# Patient Record
Sex: Female | Born: 1965 | Race: Black or African American | Hispanic: No | Marital: Single | Smoking: Current every day smoker
Health system: Southern US, Community
[De-identification: ages and names within clinical notes are randomized; demographics above are authoritative.]

## PROBLEM LIST (undated history)

## (undated) DIAGNOSIS — E0590 Thyrotoxicosis, unspecified without thyrotoxic crisis or storm: Secondary | ICD-10-CM

## (undated) DIAGNOSIS — J449 Chronic obstructive pulmonary disease, unspecified: Secondary | ICD-10-CM

## (undated) DIAGNOSIS — J45909 Unspecified asthma, uncomplicated: Secondary | ICD-10-CM

## (undated) DIAGNOSIS — E119 Type 2 diabetes mellitus without complications: Secondary | ICD-10-CM

## (undated) HISTORY — PX: BELOW KNEE LEG AMPUTATION: SUR23

---

## 2008-12-05 ENCOUNTER — Other Ambulatory Visit: Admission: RE | Admit: 2008-12-05 | Discharge: 2008-12-05 | Payer: Self-pay | Admitting: Obstetrics & Gynecology

## 2008-12-05 ENCOUNTER — Encounter: Payer: Self-pay | Admitting: Obstetrics & Gynecology

## 2008-12-05 ENCOUNTER — Ambulatory Visit: Payer: Self-pay | Admitting: Obstetrics & Gynecology

## 2008-12-05 LAB — CONVERTED CEMR LAB
Trich, Wet Prep: NONE SEEN
Yeast Wet Prep HPF POC: NONE SEEN

## 2008-12-12 ENCOUNTER — Ambulatory Visit (HOSPITAL_COMMUNITY): Admission: RE | Admit: 2008-12-12 | Discharge: 2008-12-12 | Payer: Self-pay | Admitting: Obstetrics & Gynecology

## 2008-12-26 ENCOUNTER — Ambulatory Visit: Payer: Self-pay | Admitting: Obstetrics & Gynecology

## 2008-12-26 LAB — CONVERTED CEMR LAB
MCHC: 29.6 g/dL — ABNORMAL LOW (ref 30.0–36.0)
MCV: 73.4 fL — ABNORMAL LOW (ref 78.0–100.0)
Platelets: 429 10*3/uL — ABNORMAL HIGH (ref 150–400)
RBC: 5.07 M/uL (ref 3.87–5.11)
RDW: 22.4 % — ABNORMAL HIGH (ref 11.5–15.5)

## 2009-01-23 ENCOUNTER — Ambulatory Visit (HOSPITAL_COMMUNITY): Admission: RE | Admit: 2009-01-23 | Discharge: 2009-01-23 | Payer: Self-pay | Admitting: Family Medicine

## 2009-01-23 ENCOUNTER — Ambulatory Visit: Payer: Self-pay | Admitting: Obstetrics and Gynecology

## 2009-01-23 LAB — CONVERTED CEMR LAB
MCV: 73.1 fL — ABNORMAL LOW (ref 78.0–100.0)
Platelets: 340 10*3/uL (ref 150–400)
Potassium: 4.7 meq/L (ref 3.5–5.3)
RBC: 5.4 M/uL — ABNORMAL HIGH (ref 3.87–5.11)
WBC: 9.2 10*3/uL (ref 4.0–10.5)

## 2009-02-27 ENCOUNTER — Encounter: Payer: Self-pay | Admitting: Obstetrics & Gynecology

## 2009-02-27 ENCOUNTER — Ambulatory Visit (HOSPITAL_COMMUNITY): Admission: RE | Admit: 2009-02-27 | Discharge: 2009-02-27 | Payer: Self-pay | Admitting: Obstetrics & Gynecology

## 2009-02-27 ENCOUNTER — Ambulatory Visit: Payer: Self-pay | Admitting: Obstetrics & Gynecology

## 2009-03-02 ENCOUNTER — Ambulatory Visit: Payer: Self-pay | Admitting: Family Medicine

## 2009-03-02 ENCOUNTER — Inpatient Hospital Stay (HOSPITAL_COMMUNITY): Admission: AD | Admit: 2009-03-02 | Discharge: 2009-03-02 | Payer: Self-pay | Admitting: Family Medicine

## 2009-04-17 ENCOUNTER — Ambulatory Visit: Payer: Self-pay | Admitting: Obstetrics & Gynecology

## 2010-02-27 IMAGING — CT CT ABDOMEN W/ CM
3 of 6 series · 15 of 32 positions shown, 19 images · IV contrast (40ML OMNI-MIX & 150ml omni/300%)
Comparison: None.

CT ABDOMEN

CLINICAL DATA: Abdominal and pelvic pain.  Recent ablation and
fibroid resection.

CT ABDOMEN AND PELVIS WITH CONTRAST
TECHNIQUE: Multidetector CT imaging of the abdomen and pelvis was
performed using the standard protocol following bolus
administration of intravenous contrast.
Contrast: 150 ml [id number]-Y66

[Series 2: abd pelvis · axial · 0.98mm/px · z∈[-432,-192]mm · 3 of 97 slices shown, 7 images]
[im 25/97  soft-tissue]
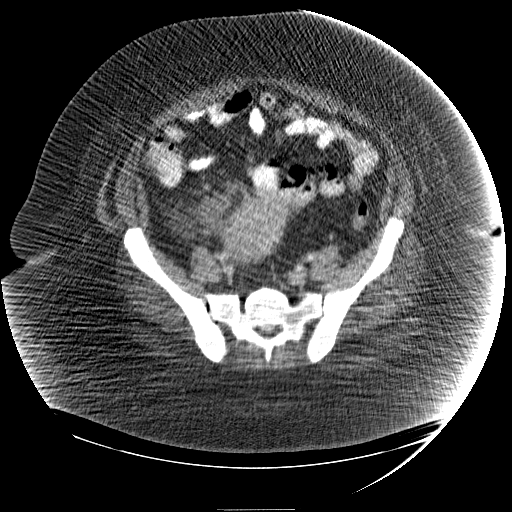
[im 25/97  lung]
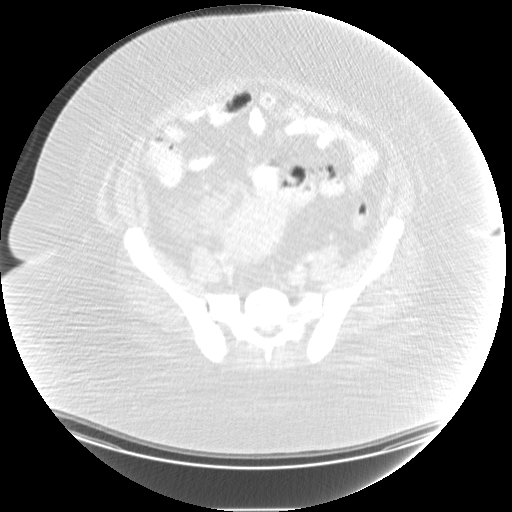
[im 25/97  bone]
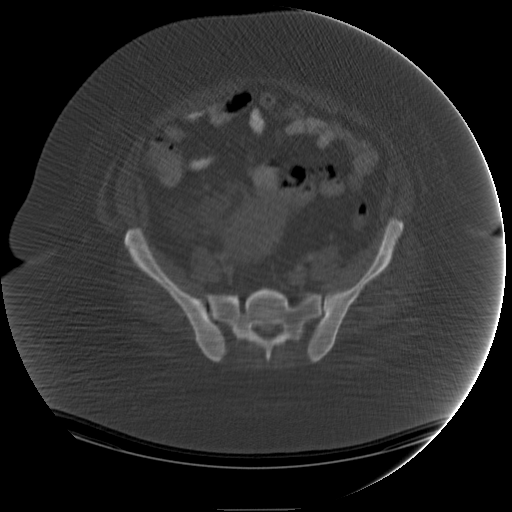
[im 49/97  soft-tissue]
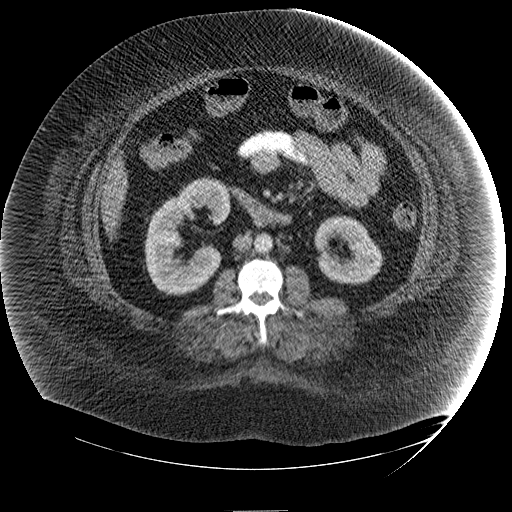
[im 49/97  lung]
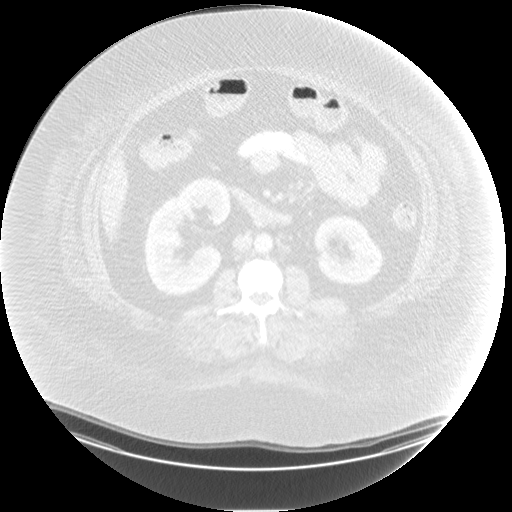
[im 73/97  soft-tissue]
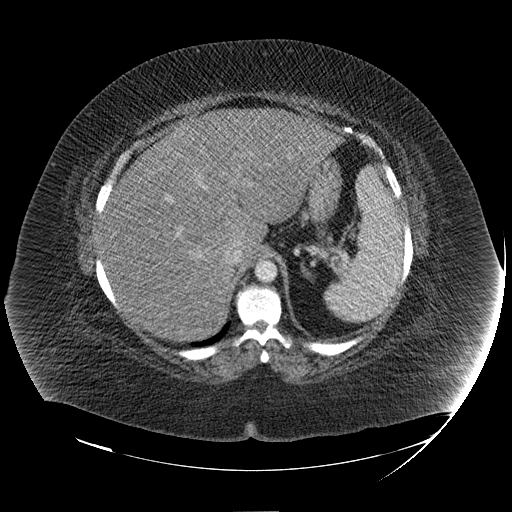
[im 73/97  lung]
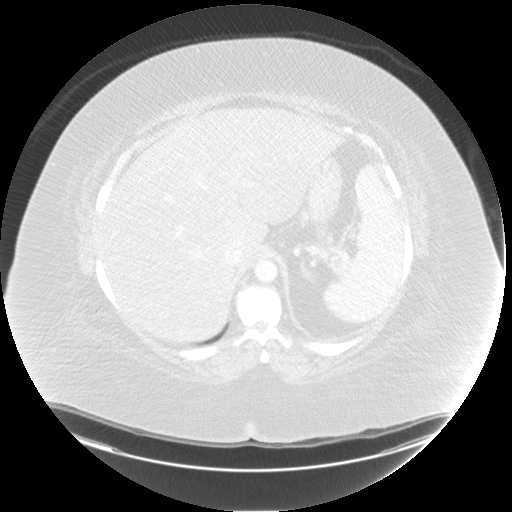

[Series 400: reformatted · coronal · 0.98mm/px · 4 of 213 slices shown (1 of 2)]
[im 27/213  soft-tissue]
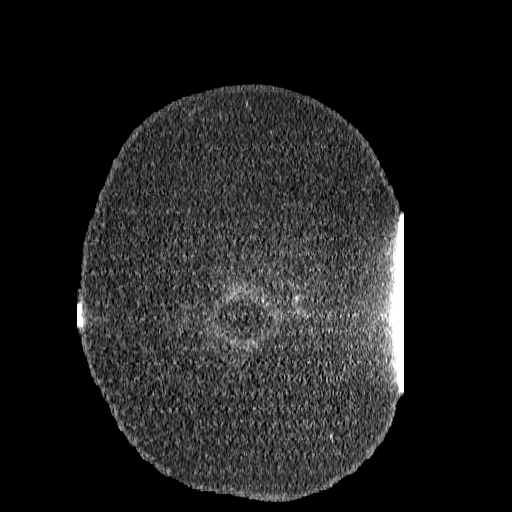
[im 54/213  soft-tissue]
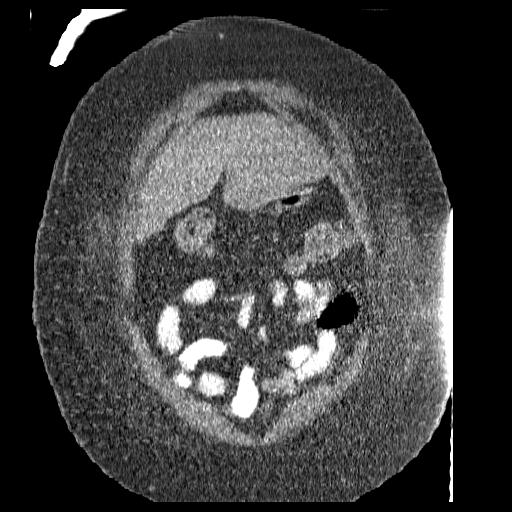
[im 80/213  soft-tissue]
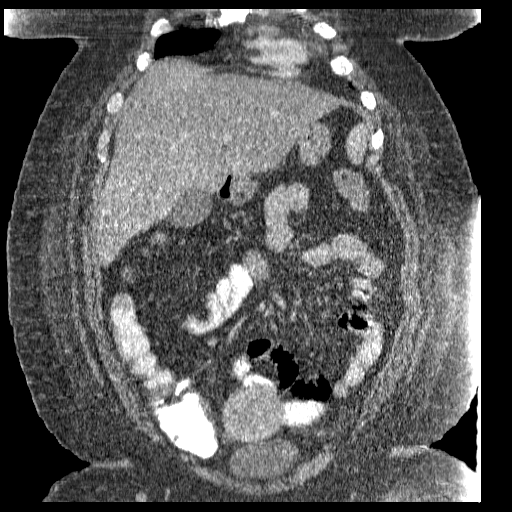
[im 107/213  soft-tissue]
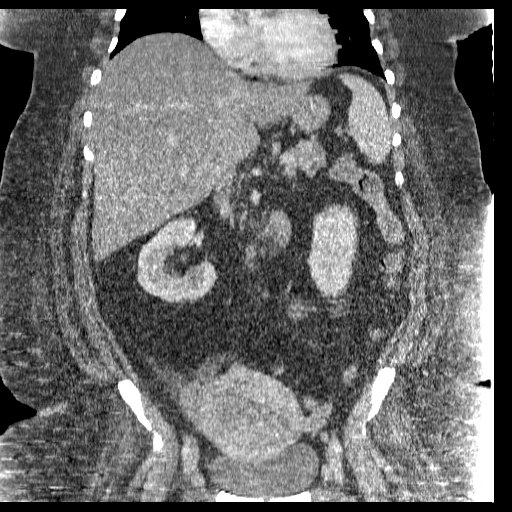

[Series 401: reformatted · sagittal · 0.98mm/px · 8 of 243 slices shown (2 of 2)]
[im 25/243  soft-tissue]
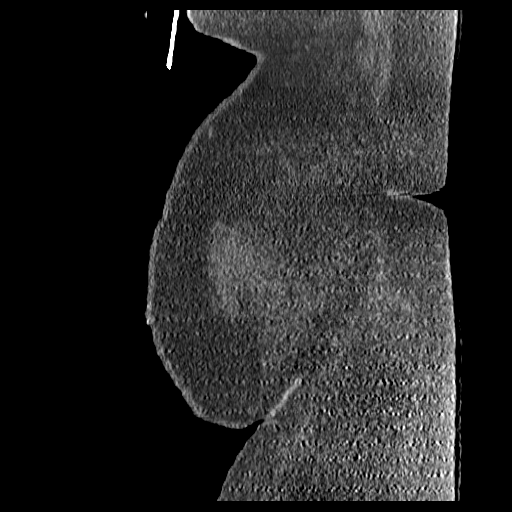
[im 49/243  soft-tissue]
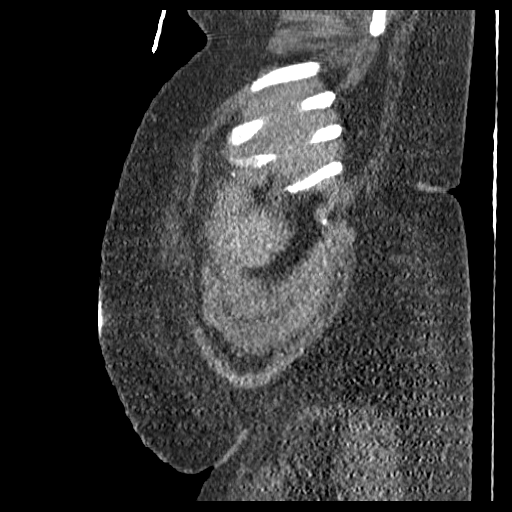
[im 73/243  soft-tissue]
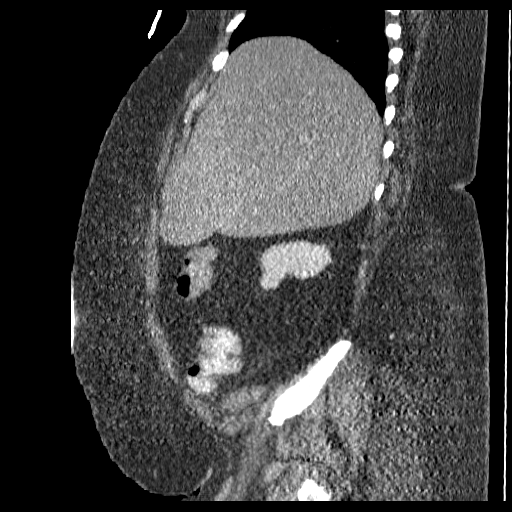
[im 97/243  soft-tissue]
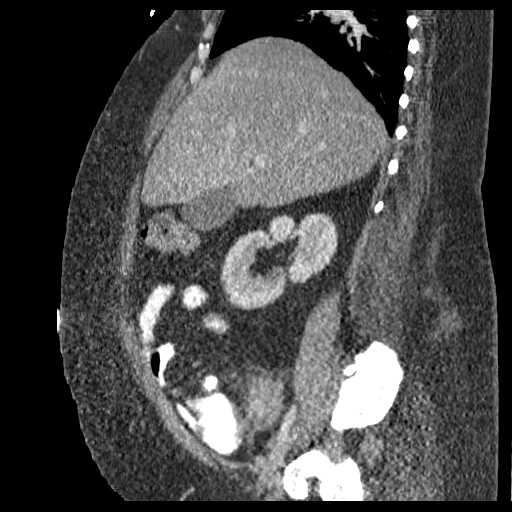
[im 146/243  soft-tissue]
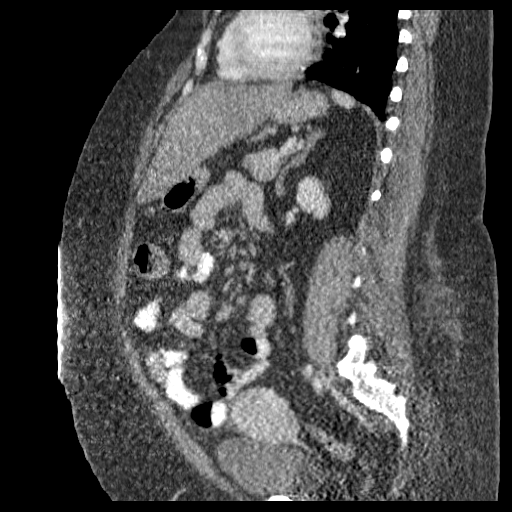
[im 170/243  soft-tissue]
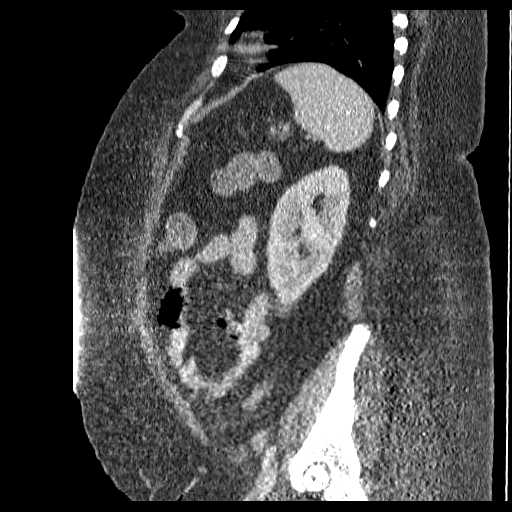
[im 194/243  soft-tissue]
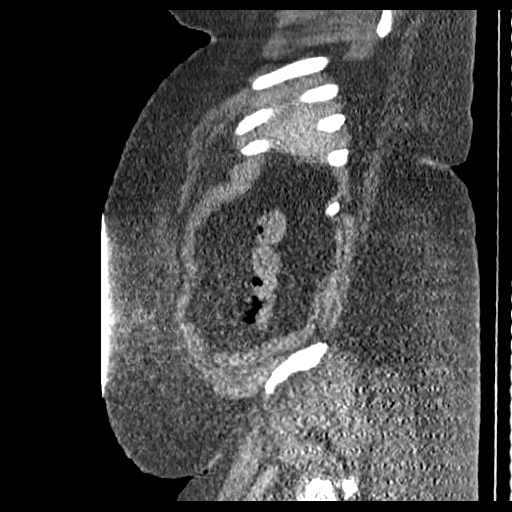
[im 218/243  soft-tissue]
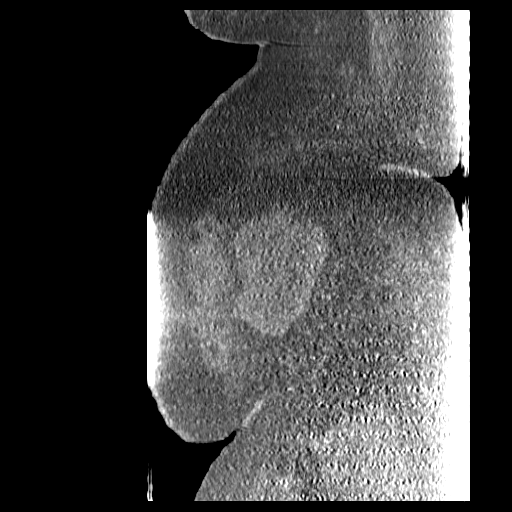

[15 of 32 positions shown; findings below may reference images not displayed]

FINDINGS: Lung bases show areas of possible air trapping.  Heart
is at the upper limits of normal in size.  No pericardial or
pleural effusion.  A 10 mm lymph node is seen adjacent to the
distal esophagus.

Liver is decreased attenuation diffusely and measures 23 cm.
Gallbladder, adrenal glands, kidneys, spleen, pancreas, stomach and
small bowel are unremarkable.  No pathologically enlarged lymph
nodes.  Gastrohepatic ligament lymph nodes are not enlarged by CT
size criteria.  No free fluid.
IMPRESSION: Fatty enlarged liver.

CT PELVIS
FINDINGS: Uterus is enlarged.  An exophytic lesion off the right
aspect of the uterus is seen with mild surrounding inflammatory
stranding, which is presumably post procedural in etiology.  Colon
and bladder are unremarkable.  Small to borderline enlarged iliac
chain lymph nodes are seen.  Index left external iliac lymph node
measures 13 mm in short axis.  No worrisome lytic or sclerotic
lesions.
IMPRESSION: 1.  Enlarged uterus is grossly enlarged fibroid uterus.
Inflammatory changes around an exophytic fibroid off the right
aspect of the uterus may be post procedural in etiology.
2.  Small to borderline enlarged iliac chain lymph nodes may be
reactive.

## 2010-10-17 LAB — BASIC METABOLIC PANEL
Calcium: 9 mg/dL (ref 8.4–10.5)
GFR calc Af Amer: >60 mL/min (ref >60)
GFR calc non Af Amer: >60 mL/min (ref >60)
Potassium: 4.3 mEq/L (ref 3.5–5.1)
Sodium: 140 mEq/L (ref 135–145)

## 2010-10-17 LAB — PREGNANCY, URINE: Preg Test, Ur: NEGATIVE

## 2010-10-17 LAB — CBC
HCT: 33.3 % — ABNORMAL LOW (ref 36.0–46.0)
HCT: 36.6 % (ref 36.0–46.0)
Hemoglobin: 11.7 g/dL — ABNORMAL LOW (ref 12.0–15.0)
MCV: 73.4 fL — ABNORMAL LOW (ref 78.0–100.0)
Platelets: 369 10*3/uL (ref 150–400)
RBC: 4.97 MIL/uL (ref 3.87–5.11)
RDW: 16.6 % — ABNORMAL HIGH (ref 11.5–15.5)
WBC: 11 10*3/uL — ABNORMAL HIGH (ref 4.0–10.5)
WBC: 9.5 10*3/uL (ref 4.0–10.5)

## 2010-10-17 LAB — URINALYSIS, ROUTINE W REFLEX MICROSCOPIC
Glucose, UA: NEGATIVE mg/dL
Ketones, ur: NEGATIVE mg/dL
Protein, ur: NEGATIVE mg/dL
Urobilinogen, UA: 1 mg/dL (ref 0.0–1.0)

## 2010-10-17 LAB — GLUCOSE, CAPILLARY: Glucose-Capillary: 155 mg/dL — ABNORMAL HIGH (ref 70–99)

## 2010-10-20 LAB — POCT PREGNANCY, URINE: Preg Test, Ur: NEGATIVE

## 2010-11-24 NOTE — Op Note (Signed)
Alyssa Gregory, Alyssa Gregory                ACCOUNT NO.:  [id number]   MEDICAL RECORD NO.:  [id number]          PATIENT TYPE:  AMB   LOCATION:  [facility]                           [facility]ACILITY:  [facility]   PHYSICIAN:  Allie Bossier, MD        DATE OF BIRTH:  04/03/1966   DATE OF PROCEDURE:  DATE OF DISCHARGE:  02/27/2009                               OPERATIVE REPORT   PREOPERATIVE DIAGNOSES:  1. Dysfunctional uterine bleeding.  2. Morbid obesity.  3. Fibroid uterus.  4. She is a smoker.   POSTOPERATIVE DIAGNOSES:  1. Dysfunctional uterine bleeding.  2. Morbid obesity.  3. Fibroid uterus.  4. She is a smoker.   PROCEDURE:  1. Dilation and curettage.  2. Hysteroscopy.  3. Attempted ThermaChoice and NovaSure endometrial ablations.  4. Completed partial excision of the submucosal fibroid.  5. Rollerball ablation of the endometrium.   SURGEON:  Allie Bossier, MD   ANESTHESIA:  LMA, Belva Agee, MD   COMPLICATIONS:  None.   ESTIMATED BLOOD LOSS:  50 mL   SPECIMENS:  Endometrial curettings and portion of submucosal fibroid.   DETAIL PROCEDURE AND FINDINGS:  The risks, benefits, and alternatives of  surgery were explained and accepted.  Consents were signed.  She was  taken to the operating room, placed in the dorsal lithotomy position.  General anesthesia was applied without complication.  Her vagina was  prepped and draped in usual sterile fashion.  Her bladder was emptied  with a Robinson catheter.  A bimanual exam revealed an 8-week sized  fibroid uterus.  The uterus sounded to 13 cm.  Cervix was dilated to  accommodate a sharp curettage.  Sharp curettage was done in all  quadrants and fundus of the uterus.  Hysteroscope was then used.  At  this point, the uterus was inspected and there was a large submucosal  fibroid noted.  I used the VersaPoint instrument initially and felt that  the fibroid was removed.  Please note that the technical difficulties  were present due to her morbid obesity  and the difficulty finding a  speculum that would provide adequate visualization.  In the end, I ended  up using a large Graves speculum with the open side; however, due to the  type of speculum, it was very difficult to maneuver the VersaPoint  resectoscope.  In any event, I felt the fibroid was removed and  proceeded with the ThermaChoice endometrial ablation.  The cavity  inspection per the automated machine and would not reach satisfaction,  therefore, I proceeded to use a NovaSure, again cavity inspection was  not satisfactory.  I then repeated the hysteroscopy to make sure there  was no perforation.  There was none visible, but by using the diagnostic  hysteroscope once more I was able to see that there was still a  significant amount of fibroid left.  At this point, the VersaPoint  resectoscope was not available and I switched to the usual resectoscope;  however, again the technical difficulties arose because of her size and  the lack of proper speculum and  I decided that since I was unable to  remove the fibroid that I would do a rollerball endometrial ablation.  I  did a rollerball endometrial ablation.  The patient was extubated and  taken to recovery room in stable condition.  Please note that I did do a  paracervical block with approximately 15 mL of 1% Xylocaine.  This was  in an attempt to help provide some postop pain relief.  She tolerated  the procedure well.  Instruments, sponge, needle counts were correct.  She was extubated and taken to recovery room in stable condition.      Allie Bossier, MD  Electronically Signed     MCD/MEDQ  D:  02/27/2009  T:  02/28/2009  Job:  [id number]

## 2010-11-24 NOTE — H&P (Signed)
Alyssa [name]:  [id number]   MEDICAL RECORD NO.:  [id number]          PATIENT TYPE:  WOC   LOCATION:  [facility]                   FACILITY:  [facility]   PHYSICIAN:  Allie Bossier, MD        DATE OF BIRTH:  [birth year]   DATE OF SERVICE:  12/26/2008                           PRE-OP HISTORY & PHYSICAL   Alyssa Gregory is a 45 year old single black gravida 2, para 0, abortus 2 who is  a referral here from [facility].  She was initially  seen by Dr. Penne Lash on Dec 05, 2008 for evaluation of menorrhagia and  severe anemia.  The patient has required blood transfusions and iron  transfusions in the last year. She says her periods have not been heavy  her whole life.  However, they have worsened over the last 2 years and  ultrasound was done on December 12, 2008 that showed a uterus measuring  14x10x10 cm. She has one 2x2.6 cm submucosal fibroid.  She has multiple  other intramural fibroids.  The endometrial lining was measured at 1.1  cm. Dr. Penne Lash did a Pap smear which came back as normal.  She also did  an endometrial biopsy that showed fragments of endometrial polyps and  proliferative type endometrium.   PAST MEDICAL HISTORY:  She is morbidly obese, weighing over 400 pounds,  menorrhagia, anemia, fibroids, hypertension, hypothyroidism. She has had  bilateral leg clots.  She has recently been taken off Coumadin.  She is  a smoker.  She has COPD and CHF.   REVIEW OF SYSTEMS:  She is having unprotected sex.  She has been  monogamous for the last 3 months and is currently filing for disability.   PAST SURGICAL HISTORY:  She said 2 D and C's, 1 elective AB and 1 for  follow-up after miscarriage.   SOCIAL HISTORY:  She smokes 1/2 pack a day currently. She has been a  smoker for 20 years.  She denies alcohol or drug use.   ALLERGIES:  No latex allergies. No known drug allergies.   FAMILY HISTORY:  Negative for breast, GYN, and colon malignancies.   REVIEW OF SYSTEMS THAT:  She has never had a mammogram, and her Pap  smear was done and was normal.   MEDICATIONS:  Metformin 1000 mg b.i.d., Requip 0.5 mg daily, Lanoxin  0.25 mg daily, glipizide 5 mg daily, albuterol 2 puffs as necessary,  amitriptyline 25 mg daily, Bystolic 5 mg daily, iron t.i.d., furosemide  20 mg daily, methimazole 10 mg b.i.d., Singulair 10 mg daily, Ventolin  as necessary, verapamil 180 mg b.i.d. Vicodin as necessary, Symbicort 2  puffs b.i.d.   PHYSICAL EXAM:  GENERAL APPEARANCE:  She is 400 pounds, height 5 feet 7.  HEENT:  Normal.  HEART:  Regular rate and rhythm.  ABDOMEN:  Morbidly obese.  PELVIC:  Uterus not palpable.  Adnexa not palpable.   ASSESSMENT/PLAN:  Fibroids resulting in anemia, polyp seen on  endometrial biopsy. I will plan for a D and C and hysteroscopy followed  by ThermaChoice endometrial ablation. In the meantime I will be giving  her a Depo-Provera injection today.  She will certainly be seen  preoperatively by anesthesia prior to any surgical procedure done.  I  would suggest that we do this under local or spinal.      Allie Bossier, MD     MCD/MEDQ  D:  12/26/2008  T:  12/26/2008  Job:  [id number]

## 2010-11-24 NOTE — Group Therapy Note (Signed)
NAMECORINTHIAN, KEMLER NO.:  [id number]   MEDICAL RECORD NO.:  [id number]          PATIENT TYPE:  WOC   LOCATION:  [facility]                   FACILITY:  [facility]   PHYSICIAN:  Elsie Lincoln, MD      DATE OF BIRTH:  1965/08/12   DATE OF SERVICE:  12/05/2008                                  CLINIC NOTE   The patient is a 45 year old G2, para 0-0-2-0, last menstrual period Nov 10, 2008, who was sent to me from [facility] for  evaluation of menorrhagia and severe anemia.  The patient has required  blood transfusions and iron transfusions in the past.  The patient has  not had heavy periods her whole life; however, they have worsened for  the past 2 years.  They were heavy, became heavier when she was placed  on Coumadin for AFib.  They have recently taken her off the Coumadin.  The patient states that it is because of the heavy bleeding.  She also  says she is having some internal bleeding.  Since then, she is going  to gastroenterologist for endoscopy.  The patient is not bleeding today.   GYNECOLOGIC HISTORY:  Heavy periods as described above.  No history of  abnormal Pap smear, but has not had a Pap smear for maybe up to 20  years.  The patient is not up to date on her mammogram, but is  scheduling one.  She is sexually active, but not using contraception.  She has no history of ovarian cysts or fibroid tumors.  She does have a  history of pubic lice as a teen.   PAST MEDICAL HISTORY:  Quite extensive.  She has anemia, hypertension,  hyperthyroidism, sarcoid, morbid obesity, history of bilateral leg  clots, AFib, obstructive sleep apnea, acanthosis nigricans, diabetes,  family history of benign polyps of large intestine, arthritis, restless  leg syndrome, and headaches.   SURGERY:  Tonsillectomy.   FAMILY HISTORY:  Diabetes in her mother, heart disease in her dad, high  blood pressure in her sister, brother, mom and dad.  Grandfather had  cancer, blood clots are extensive in her family.   MEDICATIONS:  Metformin, Coumadin, ReQuip, Lanoxin, glipizide,  albuterol, amitriptyline, Bystolic, ferrous sulfate, furosemide,  methimazole, Singulair, Ventolin, verapamil, Vicodin, and Symbicort.   ALLERGIES:  None.   SOCIAL HISTORY:  The patient smokes approximately half pack per day and  drinks caffeinated beverages.  She does not drink alcohol and does not  have a history of abuse.   REVIEW OF SYSTEMS:  Positive for numbness went from the fingers,  swelling of the legs, muscle aches, fever, night sweats, fatigue,  coughing up in form of blood, shortness of breath, chest pain, vaginal  odor and pain with intercourse.   PHYSICAL EXAMINATION:  GENERAL:  A well-nourished, well-developed in no  apparent stress.  HEENT:  Normocephalic, atraumatic.  VITAL SIGNS:  Temperature 97.2, pulse 86, blood pressure 124/84, weight  400.7 pounds, and height 5 feet 7-1/2 inches.  ABDOMEN:  Obese,  nontender, difficult to do any exam adequately.  GENITALIA:  Tanner V.  Vagina pink.  Normal rugae.  Small amount of  discharge that appears to be yeast.  Cervix is very anterior.  Bladder  and urethra well suspended and nontender.  Uterus and adnexa are not  very palpable secondary to habitus, but nontender.  EXTREMITIES:  Scant edema.   ASSESSMENT AND PLAN:  A 45 year old female with menorrhagia.  1. Endometrial biopsy.  2. Transvaginal ultrasound.  3. Pap smear done today as it has been over 20 years.  4. Return to clinic in 2-3 weeks for results.  5. The patient is to follow up on mammogram herself.           ______________________________  Elsie Lincoln, MD     KL/MEDQ  D:  12/05/2008  T:  12/06/2008  Job:  [id number]

## 2010-11-24 NOTE — Group Therapy Note (Signed)
[name], Alyssa [name]:  [id number]   MEDICAL RECORD NO.:  [id number]          PATIENT TYPE:  WOC   LOCATION:  [facility]                   FACILITY:  [facility]   PHYSICIAN:  Argentina Donovan, MD        DATE OF BIRTH:  1965/11/10   DATE OF SERVICE:  01/23/2009                                  CLINIC NOTE   The patient is a 45 year old African American female, morbidly obese at  385 pounds, who is scheduled for endometrial ablation and D and C by Dr.  Marice Potter in mid August, she was given Depo-Provera to try and control her  bleeding until then, she has chronic intractable menometrorrhagia.  She  comes back in today because she is still bleeding every day and she is  having severe cramps with this.  She stopped the Coumadin she was on and  has a bad medical history of sarcoidosis, COPD, and congestive heart  failure.  She is on many medications, among them which is Lasix.  She is  not taking the supplemental potassium, so she has started having also  leg cramps.  I am going to get a potassium level on her today and see  what that shows as well as the CBC.  With the enormous weight that this  lady is carrying, I would not be surprised that she may have fat  deposition of some of the progestin which she has been getting, so I am  going to give her a second injection of 150 of Depo-Provera and I hope  that we can help control the bleeding up until her surgery.   She seems to be satisfied with that.   IMPRESSION:  Intractable menometrorrhagia with many medical problems.   PLAN:  The plan is to increase the Depo-Provera to evaluate her  hemoglobin as well as the potassium.           ______________________________  Argentina Donovan, MD     PR/MEDQ  D:  01/23/2009  T:  01/24/2009  Job:  [id number]

## 2014-01-26 DIAGNOSIS — I739 Peripheral vascular disease, unspecified: Secondary | ICD-10-CM | POA: Insufficient documentation

## 2014-02-11 ENCOUNTER — Non-Acute Institutional Stay (SKILLED_NURSING_FACILITY): Payer: Medicare HMO | Admitting: Internal Medicine

## 2014-02-11 ENCOUNTER — Other Ambulatory Visit: Payer: Self-pay | Admitting: *Deleted

## 2014-02-11 DIAGNOSIS — E1159 Type 2 diabetes mellitus with other circulatory complications: Secondary | ICD-10-CM

## 2014-02-11 DIAGNOSIS — E0590 Thyrotoxicosis, unspecified without thyrotoxic crisis or storm: Secondary | ICD-10-CM

## 2014-02-11 DIAGNOSIS — I509 Heart failure, unspecified: Secondary | ICD-10-CM

## 2014-02-11 DIAGNOSIS — I739 Peripheral vascular disease, unspecified: Secondary | ICD-10-CM

## 2014-02-11 MED ORDER — OXYCODONE HCL 15 MG PO TABS: ORAL_TABLET | ORAL | Status: DC

## 2014-02-11 MED ORDER — FENTANYL 50 MCG/HR TD PT72: MEDICATED_PATCH | TRANSDERMAL | Status: DC

## 2014-02-11 NOTE — Telephone Encounter (Signed)
[facility] 

## 2014-02-12 DIAGNOSIS — I509 Heart failure, unspecified: Secondary | ICD-10-CM | POA: Insufficient documentation

## 2014-02-12 DIAGNOSIS — E0590 Thyrotoxicosis, unspecified without thyrotoxic crisis or storm: Secondary | ICD-10-CM | POA: Insufficient documentation

## 2014-02-12 DIAGNOSIS — I739 Peripheral vascular disease, unspecified: Secondary | ICD-10-CM | POA: Insufficient documentation

## 2014-02-12 DIAGNOSIS — E1159 Type 2 diabetes mellitus with other circulatory complications: Secondary | ICD-10-CM | POA: Insufficient documentation

## 2014-02-12 NOTE — Progress Notes (Signed)
Patient ID: Alyssa Gregory, female   DOB: February 22, 1966, 48 y.o.   MRN: [id number]                HISTORY & PHYSICAL  DATE: 02/11/2014           FACILITY: [facility] (31)  ALLERGIES:   NKDA.    CHIEF COMPLAINT:  Manage peripheral arterial disease, diabetes mellitus, and CHF.    HISTORY OF PRESENT ILLNESS:   48 year-old, African-American female is admitted to this facility for wound care and short-term rehabilitation after her recent hospitalization.    PVD:  Patient underwent right BKA and tolerated the procedure well.   She denies pain in the right BKA site.  The patient's peripheral vascular disease remains stable.  The patient denies ongoing claudication. No complications reported from the medication(s) currently being used.      CHF:The patient does not relate significant weight changes, denies sob, DOE, orthopnea, PNDs, pedal edema, palpitations or chest pain.  CHF remains stable.  No complications form the medications being used.    DM:pt's DM remains stable.  Pt denies polyuria, polydipsia, polyphagia, changes in vision or hypoglycemic episodes.  No complications noted from the medication presently being used.  Last hemoglobin A1c is:  Not available.     PAST MEDICAL HISTORY :    Diabetes mellitus.     CHF.    Anemia.    Sarcoidosis.    Hypertension.    CVA.    Sleep apnea.    Glaucoma.    GERD.    Hypothyroidism.    Hemorrhoids.    History of DVT.    PAST SURGICAL HISTORY:  Endometrial oblation.    Tonsillectomy.    Eye surgery.    SOCIAL HISTORY: MARITAL HISTORY:  Patient is single.   TOBACCO USE:  She is a smoker of half a pack per day of cigarettes.   ALCOHOL:  Denies alcohol use.   ILLICIT DRUGS:  Denies illicit drug use.    FAMILY HISTORY: none  CURRENT MEDICATIONS: Reviewed per MAR/see medication list  REVIEW OF SYSTEMS:  See HPI otherwise 14 point ROS is negative.  PHYSICAL EXAMINATION  VS:  See  VS section  GENERAL: no acute distress, morbidly obese body habitus EYES: conjunctivae normal, sclerae normal, normal eye lids MOUTH/THROAT: lips without lesions,no lesions in the mouth,tongue is without lesions,uvula elevates in midline NECK: supple, trachea midline, no neck masses, no thyroid tenderness, no thyromegaly LYMPHATICS: no LAN in the neck, no supraclavicular LAN RESPIRATORY: breathing is even & unlabored, BS CTAB CARDIAC: RRR, no murmur,no extra heart sounds, no edema GI:  ABDOMEN: abdomen soft, normal BS, no masses, no tenderness  LIVER/SPLEEN: no hepatomegaly, no splenomegaly MUSCULOSKELETAL: HEAD: normal to inspection  EXTREMITIES: LEFT UPPER EXTREMITY: full range of motion, normal strength & tone RIGHT UPPER EXTREMITY:  full range of motion, normal strength & tone LEFT LOWER EXTREMITY:  full range of motion, normal strength & tone RIGHT LOWER EXTREMITY: right BKA, full range of motion, normal strength & tone PSYCHIATRIC: the patient is alert & oriented to person, affect & behavior appropriate  LABS/RADIOLOGY:  On 01/30/2014:  WBC 12.3, hemoglobin 10.1, MCV 68, platelets 441.    Glucose 158, otherwise BMP normal.    ASSESSMENT/PLAN:  PVD.  Status post right BKA.  Continue wound care.    CHF.  Well compensated.    Diabetes mellitus.  Continue metformin and Victoza.    Hyperthyroidism.  Continue  methimazole.    Hypertension.  Well controlled.    Neuropathy.  Continue Neurontin.    Microcytic anemia.  Check iron studies.    Check CBC and BMP.      I have reviewed patient's medical records received at admission/from hospitalization.  CPT CODE: 16579          Alyssa Gregory, [place] [phone]

## 2014-02-14 ENCOUNTER — Encounter (HOSPITAL_COMMUNITY): Payer: Self-pay | Admitting: Emergency Medicine

## 2014-02-14 ENCOUNTER — Emergency Department (HOSPITAL_COMMUNITY)
Admission: EM | Admit: 2014-02-14 | Discharge: 2014-02-15 | Disposition: A | Discharge status: Other Acute Inpatient Hospital | Payer: Medicare HMO | Attending: Emergency Medicine | Admitting: Emergency Medicine

## 2014-02-14 DIAGNOSIS — Y838 Other surgical procedures as the cause of abnormal reaction of the patient, or of later complication, without mention of misadventure at the time of the procedure: Secondary | ICD-10-CM | POA: Insufficient documentation

## 2014-02-14 DIAGNOSIS — J449 Chronic obstructive pulmonary disease, unspecified: Secondary | ICD-10-CM | POA: Diagnosis not present

## 2014-02-14 DIAGNOSIS — J4489 Other specified chronic obstructive pulmonary disease: Secondary | ICD-10-CM | POA: Insufficient documentation

## 2014-02-14 DIAGNOSIS — T814XXA Infection following a procedure, initial encounter: Secondary | ICD-10-CM

## 2014-02-14 DIAGNOSIS — T8140XA Infection following a procedure, unspecified, initial encounter: Secondary | ICD-10-CM | POA: Diagnosis not present

## 2014-02-14 DIAGNOSIS — E119 Type 2 diabetes mellitus without complications: Secondary | ICD-10-CM | POA: Insufficient documentation

## 2014-02-14 DIAGNOSIS — IMO0001 Reserved for inherently not codable concepts without codable children: Secondary | ICD-10-CM

## 2014-02-14 DIAGNOSIS — F17200 Nicotine dependence, unspecified, uncomplicated: Secondary | ICD-10-CM | POA: Diagnosis not present

## 2014-02-14 DIAGNOSIS — A419 Sepsis, unspecified organism: Secondary | ICD-10-CM | POA: Insufficient documentation

## 2014-02-14 DIAGNOSIS — R509 Fever, unspecified: Secondary | ICD-10-CM | POA: Diagnosis present

## 2014-02-14 HISTORY — DX: Type 2 diabetes mellitus without complications: E11.9

## 2014-02-14 HISTORY — DX: Unspecified asthma, uncomplicated: J45.909

## 2014-02-14 HISTORY — DX: Chronic obstructive pulmonary disease, unspecified: J44.9

## 2014-02-14 HISTORY — DX: Thyrotoxicosis, unspecified without thyrotoxic crisis or storm: E05.90

## 2014-02-14 NOTE — ED Notes (Signed)
Per EMS, pt had BKA on July 22nd. Has had increased pain at surgical site and developed a fever today. Last temp taken at home was "around 101" per pt. Family was concerned and wants pt to be evaluated for infection.

## 2014-02-15 ENCOUNTER — Emergency Department (HOSPITAL_COMMUNITY): Payer: Medicare HMO

## 2014-02-15 DIAGNOSIS — J449 Chronic obstructive pulmonary disease, unspecified: Secondary | ICD-10-CM | POA: Diagnosis not present

## 2014-02-15 DIAGNOSIS — R509 Fever, unspecified: Secondary | ICD-10-CM | POA: Diagnosis present

## 2014-02-15 DIAGNOSIS — T8140XA Infection following a procedure, unspecified, initial encounter: Secondary | ICD-10-CM | POA: Diagnosis not present

## 2014-02-15 DIAGNOSIS — A419 Sepsis, unspecified organism: Secondary | ICD-10-CM | POA: Diagnosis not present

## 2014-02-15 DIAGNOSIS — Y838 Other surgical procedures as the cause of abnormal reaction of the patient, or of later complication, without mention of misadventure at the time of the procedure: Secondary | ICD-10-CM | POA: Diagnosis not present

## 2014-02-15 DIAGNOSIS — F17200 Nicotine dependence, unspecified, uncomplicated: Secondary | ICD-10-CM | POA: Diagnosis not present

## 2014-02-15 DIAGNOSIS — E119 Type 2 diabetes mellitus without complications: Secondary | ICD-10-CM | POA: Diagnosis not present

## 2014-02-15 LAB — URINALYSIS, ROUTINE W REFLEX MICROSCOPIC
BILIRUBIN URINE: NEGATIVE
Glucose, UA: NEGATIVE mg/dL
KETONES UR: NEGATIVE mg/dL
Nitrite: POSITIVE — AB
PH: 5 (ref 5.0–8.0)
PROTEIN: NEGATIVE mg/dL
Specific Gravity, Urine: 1.016 (ref 1.005–1.030)
Urobilinogen, UA: 1 mg/dL (ref 0.0–1.0)

## 2014-02-15 LAB — COMPREHENSIVE METABOLIC PANEL
ALBUMIN: 2.2 g/dL — AB (ref 3.5–5.2)
ALK PHOS: 254 U/L — AB (ref 39–117)
ALT: 34 U/L (ref 0–35)
AST: 15 U/L (ref 0–37)
Anion gap: 14 (ref 5–15)
BILIRUBIN TOTAL: 0.2 mg/dL — AB (ref 0.3–1.2)
BUN: 21 mg/dL (ref 6–23)
CHLORIDE: 95 meq/L — AB (ref 96–112)
CO2: 24 meq/L (ref 19–32)
CREATININE: 0.63 mg/dL (ref 0.50–1.10)
Calcium: 9.1 mg/dL (ref 8.4–10.5)
GFR calc Af Amer: >90 mL/min (ref >90)
Glucose, Bld: 154 mg/dL — ABNORMAL HIGH (ref 70–99)
POTASSIUM: 4.9 meq/L (ref 3.7–5.3)
Sodium: 133 mEq/L — ABNORMAL LOW (ref 137–147)
Total Protein: 7.3 g/dL (ref 6.0–8.3)

## 2014-02-15 LAB — CBC WITH DIFFERENTIAL/PLATELET
Basophils Absolute: 0 10*3/uL (ref 0.0–0.1)
Basophils Relative: 0 % (ref 0–1)
EOS PCT: 0 % (ref 0–5)
Eosinophils Absolute: 0 10*3/uL (ref 0.0–0.7)
HEMATOCRIT: 27.5 % — AB (ref 36.0–46.0)
Hemoglobin: 8.1 g/dL — ABNORMAL LOW (ref 12.0–15.0)
LYMPHS PCT: 18 % (ref 12–46)
Lymphs Abs: 2.9 10*3/uL (ref 0.7–4.0)
MCH: 19.5 pg — ABNORMAL LOW (ref 26.0–34.0)
MCHC: 29.5 g/dL — ABNORMAL LOW (ref 30.0–36.0)
MCV: 66.3 fL — AB (ref 78.0–100.0)
MONOS PCT: 13 % — AB (ref 3–12)
Monocytes Absolute: 2.1 10*3/uL — ABNORMAL HIGH (ref 0.1–1.0)
NEUTROS ABS: 11.1 10*3/uL — AB (ref 1.7–7.7)
Neutrophils Relative %: 69 % (ref 43–77)
Platelets: 510 10*3/uL — ABNORMAL HIGH (ref 150–400)
RBC: 4.15 MIL/uL (ref 3.87–5.11)
RDW: 18.5 % — ABNORMAL HIGH (ref 11.5–15.5)
WBC: 16.1 10*3/uL — AB (ref 4.0–10.5)

## 2014-02-15 LAB — URINE MICROSCOPIC-ADD ON

## 2014-02-15 LAB — I-STAT CG4 LACTIC ACID, ED: LACTIC ACID, VENOUS: 1.99 mmol/L (ref 0.5–2.2)

## 2014-02-15 LAB — CBG MONITORING, ED: GLUCOSE-CAPILLARY: 121 mg/dL — AB (ref 70–99)

## 2014-02-15 MED ORDER — HYDROMORPHONE HCL PF 1 MG/ML IJ SOLN
1.0000 mg | Freq: Once | INTRAMUSCULAR | Status: AC
  Administered 2014-02-15: 1 mg via INTRAVENOUS
  Filled 2014-02-15: qty 1

## 2014-02-15 MED ORDER — SODIUM CHLORIDE 0.9 % IV SOLN
1000.0000 mL | INTRAVENOUS | Status: DC
  Administered 2014-02-15: 1000 mL via INTRAVENOUS

## 2014-02-15 MED ORDER — SODIUM CHLORIDE 0.9 % IV BOLUS (SEPSIS)
30.0000 mL/kg | Freq: Once | INTRAVENOUS | Status: AC
  Administered 2014-02-15: 4560 mL via INTRAVENOUS

## 2014-02-15 MED ORDER — PIPERACILLIN-TAZOBACTAM 3.375 G IVPB 30 MIN
3.3750 g | Freq: Once | INTRAVENOUS | Status: AC
  Administered 2014-02-15: 3.375 g via INTRAVENOUS
  Filled 2014-02-15: qty 50

## 2014-02-15 MED ORDER — SODIUM CHLORIDE 0.9 % IV SOLN
2000.0000 mg | Freq: Once | INTRAVENOUS | Status: AC
  Administered 2014-02-15: 2000 mg via INTRAVENOUS
  Filled 2014-02-15: qty 2000

## 2014-02-15 MED ORDER — VANCOMYCIN HCL IN DEXTROSE 1-5 GM/200ML-% IV SOLN: 1000.0000 mg | Freq: Once | INTRAVENOUS | Status: DC

## 2014-02-15 MED ORDER — PIPERACILLIN-TAZOBACTAM 3.375 G IVPB: 3.3750 g | Freq: Three times a day (TID) | INTRAVENOUS | Status: DC

## 2014-02-15 MED ORDER — ONDANSETRON HCL 4 MG/2ML IJ SOLN
4.0000 mg | Freq: Once | INTRAMUSCULAR | Status: AC
  Administered 2014-02-15: 4 mg via INTRAVENOUS
  Filled 2014-02-15: qty 2

## 2014-02-15 MED ORDER — SODIUM CHLORIDE 0.9 % IV SOLN: 1500.0000 mg | Freq: Two times a day (BID) | INTRAVENOUS | Status: DC

## 2014-02-15 NOTE — ED Notes (Signed)
X-ray at bedside

## 2014-02-15 NOTE — ED Notes (Signed)
[facility] at bedside getting patient ready for transport.

## 2014-02-15 NOTE — ED Notes (Signed)
Lab at bedside to collect blood cultures.

## 2014-02-15 NOTE — ED Provider Notes (Signed)
CSN: [id number]     Arrival date & time 02/14/14  2309 History   First MD Initiated Contact with Patient 02/15/14 0013     Chief Complaint  Patient presents with  . Fever  . Post-op Problem     (Consider location/radiation/quality/duration/timing/severity/associated sxs/prior Treatment) HPI Right leg stent and BKA within the last 2 weeks by Dr. Raul Del at [facility] patient did well until today developed fever pain redness to incision and bleeding from stump with nausea no confusion no shortness breath no abdominal pain no vomiting.  Right BKA no dehiscence small amount of bloody discharge no purulent discharge does have localized erythema tenderness increased warmth  Past Medical History  Diagnosis Date  . Diabetes mellitus without complication   . COPD (chronic obstructive pulmonary disease)   . Hyperthyroidism   . Asthma    Past Surgical History  Procedure Laterality Date  . Below knee leg amputation     History reviewed. No pertinent family history. History  Substance Use Topics  . Smoking status: Current Every Day Smoker    Types: Cigarettes  . Smokeless tobacco: Not on file  . Alcohol Use: No   OB History   Grav Para Term Preterm Abortions TAB SAB Ect Mult Living                 Review of Systems 10 Systems reviewed and are negative for acute change except as noted in the HPI.   Allergies  Review of patient's allergies indicates no known allergies.  Home Medications   Prior to Admission medications   Medication Sig Start Date End Date Taking? Authorizing Provider  fentaNYL (DURAGESIC - DOSED MCG/HR) 50 MCG/HR Apply one patch every 72 hours. Remove old patch. External Use only. Rotate sites 02/11/14   Gayland Curry, DO  oxyCODONE (ROXICODONE) 15 MG immediate release tablet Take one tablet by mouth every six hours as needed for pain 02/11/14   Tiffany L Reed, DO   BP 92/51  Pulse 102  Temp(Src) 100.8 F (38.2 C) (Rectal)  Resp 20  Wt 336 lb (152.409 kg)  SpO2  98%  LMP 01/09/2014 Physical Exam  Nursing note and vitals reviewed. Constitutional:  Awake, alert, nontoxic appearance.  HENT:  Head: Atraumatic.  Eyes: Right eye exhibits no discharge. Left eye exhibits no discharge.  Neck: Neck supple.  Cardiovascular: Regular rhythm.   No murmur heard. tachycardic  Pulmonary/Chest: Effort normal and breath sounds normal. No respiratory distress. She has no wheezes. She has no rales. She exhibits no tenderness.  Abdominal: Soft. Bowel sounds are normal. She exhibits no distension and no mass. There is no tenderness. There is no rebound and no guarding.  Musculoskeletal: She exhibits tenderness.  Baseline ROM, no obvious new focal weakness. Right BKA stump intact staples without dehiscence but scant bloody drainage and surrounding erythema, tenderness, and increased warmth c/w wound infection  Neurological: She is alert.  Mental status and motor strength appears baseline for patient and situation.  Skin: No rash noted.  Psychiatric: She has a normal mood and affect.    ED Course  Procedures (including critical care time) 0300 d/w [facility] Dr. Laurance Flatten for transfer  Labs Review Labs Reviewed  CBC WITH DIFFERENTIAL - Abnormal; Notable for the following:    WBC 16.1 (*)    Hemoglobin 8.1 (*)    HCT 27.5 (*)    MCV 66.3 (*)    MCH 19.5 (*)    MCHC 29.5 (*)    RDW 18.5 (*)  Platelets 510 (*)    Monocytes Relative 13 (*)    Neutro Abs 11.1 (*)    Monocytes Absolute 2.1 (*)    All other components within normal limits  COMPREHENSIVE METABOLIC PANEL - Abnormal; Notable for the following:    Sodium 133 (*)    Chloride 95 (*)    Glucose, Bld 154 (*)    Albumin 2.2 (*)    Alkaline Phosphatase 254 (*)    Total Bilirubin 0.2 (*)    All other components within normal limits  URINALYSIS, ROUTINE W REFLEX MICROSCOPIC - Abnormal; Notable for the following:    APPearance CLOUDY (*)    Hgb urine dipstick MODERATE (*)    Nitrite POSITIVE (*)     Leukocytes, UA MODERATE (*)    All other components within normal limits  URINE MICROSCOPIC-ADD ON - Abnormal; Notable for the following:    Bacteria, UA MANY (*)    All other components within normal limits  CBG MONITORING, ED - Abnormal; Notable for the following:    Glucose-Capillary 121 (*)    All other components within normal limits  CULTURE, BLOOD (ROUTINE X 2)  CULTURE, BLOOD (ROUTINE X 2)  URINE CULTURE  WOUND CULTURE  I-STAT CG4 LACTIC ACID, ED    Imaging Review Dg Chest Port 1 View  (if Code Sepsis Called)  02/15/2014   CLINICAL DATA:  Fever.  EXAM: PORTABLE CHEST - 1 VIEW  COMPARISON:  Chest radiograph February 12, 2013  FINDINGS: Cardiomediastinal silhouette is unremarkable. Patient is rotated to the right, accentuating left lung markings. Low inspiratory examination with crowded vascular markings. No pleural effusions or focal consolidations. No pneumothorax. Large body habitus. Osseous structures are nonsuspicious.  IMPRESSION: No acute cardiopulmonary process for this low inspiratory portable examination.   Electronically Signed   By: Elon Alas   On: 02/15/2014 01:04   Dg Knee Right Port  02/15/2014   CLINICAL DATA:  Right lower leg amputation 01/30/2014.  Diabetes  EXAM: PORTABLE RIGHT KNEE - 1-2 VIEW  COMPARISON:  None.  FINDINGS: Amputation below the knee .  Osteotomy sites appear well-defined.  Multiple gas bubbles are present in the soft tissues of the stump suggestive of soft tissue infection. Gas appears more extensive and irregular than would be expected for postoperative gas. Correlate with symptoms.  There is a stent in the distal SFA and proximal popliteal artery.  IMPRESSION: Extensive gas in the soft tissues of the stump suggestive of infection.   Electronically Signed   By: Franchot Gallo M.D.   On: 02/15/2014 01:08     EKG Interpretation None      MDM   Final diagnoses:  Postoperative wound infection, initial encounter  Sepsis, due to unspecified  organism    Patient / Family / Caregiver informed of clinical course, understand medical decision-making process, and agree with plan.Pt stable in ED with no significant deterioration in condition.  The patient appears reasonably stabilized for transfer considering the current resources, flow, and capabilities available in the ED at this time, and I doubt any other [facility] requiring further screening and/or treatment in the ED prior to transfer.  Babette Relic, MD 02/16/14 [phone]

## 2014-02-15 NOTE — ED Notes (Signed)
Pt brought back from [place] truck and put in [place] for pt to use bedpan. Bedpan left in room, full of urine, spilled on bed, and [facility] staff did not tell staff.

## 2014-02-15 NOTE — Progress Notes (Signed)
ANTIBIOTIC CONSULT NOTE - INITIAL  Pharmacy Consult for Vancomycin/Zosyn  Indication: Cellulitis  No Known Allergies  Patient Measurements: Weight: 336 lb (152.409 kg)  Vital Signs: Temp: 100.8 F (38.2 C) (08/06 2324) Temp src: Rectal (08/06 2324) BP: 119/52 mmHg (08/07 0000) Pulse Rate: 99 (08/07 0000)  Labs:  Recent Labs  02/15/14 0020  WBC 16.1*  HGB 8.1*  PLT 510*  CREATININE 0.63    Medical History: Past Medical History  Diagnosis Date  . Diabetes mellitus without complication   . COPD (chronic obstructive pulmonary disease)   . Hyperthyroidism   . Asthma    Assessment: 48 y/o F s/p BKA on 7/22, here with pain/fever, to start vancomycin/zosyn for cellulitis. WBC elevated, renal function ok, Tmax 100.8, other labs as above.   Goal of Therapy:  Vancomycin trough level 10-15 mcg/ml  Plan:  -Vancomycin 2000 mg IV x 1, then 1500 mg IV q12h -Zosyn 3.375G IV q8h to be infused over 4 hours -Trend WBC, temp, renal function  -Drug levels as indicated   Narda Bonds 02/15/2014,1:52 AM

## 2014-02-17 LAB — WOUND CULTURE: GRAM STAIN: NONE SEEN

## 2014-02-17 LAB — URINE CULTURE: Colony Count: >=100000

## 2014-02-18 ENCOUNTER — Telehealth (HOSPITAL_COMMUNITY): Payer: Self-pay

## 2014-02-18 MED FILL — Hydromorphone HCl Preservative Free (PF) Inj 2 MG/ML: INTRAMUSCULAR | Qty: 1 | Status: AC

## 2014-02-21 LAB — CULTURE, BLOOD (ROUTINE X 2)
CULTURE: NO GROWTH
CULTURE: NO GROWTH

## 2014-03-01 ENCOUNTER — Non-Acute Institutional Stay (SKILLED_NURSING_FACILITY): Payer: Medicare HMO | Admitting: Internal Medicine

## 2014-03-01 DIAGNOSIS — E1159 Type 2 diabetes mellitus with other circulatory complications: Secondary | ICD-10-CM

## 2014-03-01 NOTE — Progress Notes (Signed)
Patient ID: Alyssa Gregory, female   DOB: 1966-07-09, 48 y.o.   MRN: [id number] Facility; [facility] Chief complaint; review of right above-knee amputation site, diabetes History; I was asked to look at this patient who has just returned to the facility. She was admitted here earlier this month after having a right BKA. He is a type II diabetic care with known PAD. Per discussion with the wound care staff and the facility she came here with a nonhealing area on the right stump appeared that she return to see surgery and was noted to have a fairly significant stump infection. She underwent a right above-knee amputation and I am seeing her in followup with this. This surgery was done at [facility].  With regards to her type 2 diabetes she is currently on Levemir 8 units at bedtime and a NovoLog sliding scale. Her blood sugars are in the mid to upper 100s. For now that is satisfactory.  Past medical history #1 status post above knee education on the right Anemia Obstructive sleep apnea on CPAP Morbid obesity with a BMI of 50 Hypertension Hyperthyroidism Chronic anticoagulation Type 2 diabetes with PAD  Current medications Coreg 6.25 twice a day Singulair 10 mg a day Elavil 10 mg daily Lasix 40 daily Neurontin 3 capsules 1200 mg 3 times a day Lisinopril 10 daily Glipizide 500 twice a day Tapazole 10 by mouth twice a day Savella 25 by mouth twice a day Xarelto 20 mg daily Requip 0.25 each bedtime Fentanyl 50 mcg per hour q. 72 Oxycodone 15 mg every 4 when necessary ASA 81 daily Lipitor 20 daily Valium 5 mg every 6 when necessary. Levemir 8 units each bedtime  Physical examination Gen. patient is not in any distress. She is awake alert conversational Cardiac heart sounds are normal there is no murmurs Right extremity surgical site is clean uninfected well approximated with staples still in place  Impression/plan #1 stable right above-knee amputation #2 Type 2  diabetes on insulin. For the moment this appears to be stable no adjustments made

## 2014-03-04 ENCOUNTER — Non-Acute Institutional Stay (SKILLED_NURSING_FACILITY): Payer: Medicare HMO | Admitting: Internal Medicine

## 2014-03-04 DIAGNOSIS — E0590 Thyrotoxicosis, unspecified without thyrotoxic crisis or storm: Secondary | ICD-10-CM

## 2014-03-04 DIAGNOSIS — G609 Hereditary and idiopathic neuropathy, unspecified: Secondary | ICD-10-CM

## 2014-03-04 DIAGNOSIS — E1059 Type 1 diabetes mellitus with other circulatory complications: Secondary | ICD-10-CM

## 2014-03-04 DIAGNOSIS — I739 Peripheral vascular disease, unspecified: Secondary | ICD-10-CM

## 2014-03-05 DIAGNOSIS — E1051 Type 1 diabetes mellitus with diabetic peripheral angiopathy without gangrene: Secondary | ICD-10-CM | POA: Insufficient documentation

## 2014-03-05 DIAGNOSIS — G609 Hereditary and idiopathic neuropathy, unspecified: Secondary | ICD-10-CM | POA: Insufficient documentation

## 2014-03-05 DIAGNOSIS — E1059 Type 1 diabetes mellitus with other circulatory complications: Secondary | ICD-10-CM | POA: Insufficient documentation

## 2014-03-05 NOTE — Progress Notes (Signed)
HISTORY & PHYSICAL  DATE: 03/04/2014   FACILITY: [facility]  LEVEL OF CARE: SNF (31)  ALLERGIES:  No Known Allergies  CHIEF COMPLAINT:  Manage PVD, hyperthyroidism and diabetes mellitus  HISTORY OF PRESENT ILLNESS: 48 year old African American female is admitted to this facility for short-term rehabilitation after her recent hospitalization.  PVD: The patient's peripheral vascular disease remains table. The patient denies ongoing claudication. No complications reported from the medication(s) currently being used. Patient had a right BKA due to ischemia. However she developed significant stump infection and had to undergo a right AKA. She tolerated the procedure well. She denies stump pain.  HYPERTHYROIDISM: Patient's hyperthyroidism remains stable.  Patient denies anxiety, irritability or palpitations. No complications reported from current medication being used. Last TFTs are: Not available.  DM:pt's DM remains stable.  Pt denies polyuria, polydipsia, polyphagia, changes in vision or hypoglycemic episodes.  No complications noted from the medication presently being used.  Last hemoglobin A1c is: Not available.  PAST MEDICAL HISTORY :  Past Medical History  Diagnosis Date  . Diabetes mellitus without complication   . COPD (chronic obstructive pulmonary disease)   . Hyperthyroidism   . Asthma     PAST SURGICAL HISTORY: Past Surgical History  Procedure Laterality Date  . Below knee leg amputation      SOCIAL HISTORY:  reports that she has been smoking Cigarettes.  She has been smoking about 0.00 packs per day. She does not have any smokeless tobacco history on file. She reports that she does not drink alcohol or use illicit drugs.  FAMILY HISTORY: None  CURRENT MEDICATIONS: Reviewed per MAR/see medication list  REVIEW OF SYSTEMS:  Cardiac -complains of lower extremity swelling, See HPI otherwise 14 point ROS is negative.  PHYSICAL  EXAMINATION  VS:  See VS section  GENERAL: no acute distress, morbidly obese body habitus EYES: conjunctivae normal, sclerae normal, normal eye lids MOUTH/THROAT: lips without lesions,no lesions in the mouth,tongue is without lesions,uvula elevates in midline NECK: supple, trachea midline, no neck masses, no thyroid tenderness, no thyromegaly LYMPHATICS: no LAN in the neck, no supraclavicular LAN RESPIRATORY: breathing is even & unlabored, BS CTAB CARDIAC: RRR, no murmur,no extra heart sounds, left lower extremity +2 edema GI:  ABDOMEN: abdomen soft, normal BS, no masses, no tenderness  LIVER/SPLEEN: no hepatomegaly, no splenomegaly MUSCULOSKELETAL: HEAD: normal to inspection  EXTREMITIES: LEFT UPPER EXTREMITY: full range of motion, normal strength & tone RIGHT UPPER EXTREMITY:  full range of motion, normal strength & tone LEFT LOWER EXTREMITY:  full range of motion, normal strength & tone RIGHT LOWER EXTREMITY:  AKA PSYCHIATRIC: the patient is alert & oriented to person, affect & behavior appropriate  LABS/RADIOLOGY:  Labs reviewed: Basic Metabolic Panel:  Recent Labs  02/15/14 0020  NA 133*  K 4.9  CL 95*  CO2 24  GLUCOSE 154*  BUN 21  CREATININE 0.63  CALCIUM 9.1   Liver Function Tests:  Recent Labs  02/15/14 0020  AST 15  ALT 34  ALKPHOS 254*  BILITOT 0.2*  PROT 7.3  ALBUMIN 2.2*   CBC:  Recent Labs  02/15/14 0020  WBC 16.1*  NEUTROABS 11.1*  HGB 8.1*  HCT 27.5*  MCV 66.3*  PLT 510*   CBG:  Recent Labs  02/15/14 0352  GLUCAP 121*    CT ABDOMEN AND PELVIS WITH CONTRAST    Technique: Multidetector CT imaging of the abdomen and pelvis was performed using the standard protocol  following bolus administration of intravenous contrast.    Contrast: 150 ml Omnipaque-300    Comparison: None.    CT ABDOMEN    Findings:  Lung bases show areas of possible air trapping.  Heart is at the upper limits of normal in size.  No pericardial  or pleural effusion.  A 10 mm lymph node is seen adjacent to the distal esophagus.    Liver is decreased attenuation diffusely and measures 23 cm. Gallbladder, adrenal glands, kidneys, spleen, pancreas, stomach and small bowel are unremarkable.  No pathologically enlarged lymph nodes.  Gastrohepatic ligament lymph nodes are not enlarged by CT size criteria.  No free fluid.    IMPRESSION: Fatty enlarged liver.    CT PELVIS    Findings:  Uterus is enlarged.  An exophytic lesion off the right aspect of the uterus is seen with mild surrounding inflammatory stranding, which is presumably post procedural in etiology.  Colon and bladder are unremarkable.  Small to borderline enlarged iliac chain lymph nodes are seen.  Index left external iliac lymph node measures 13 mm in short axis.  No worrisome lytic or sclerotic lesions.    IMPRESSION:    1.  Enlarged uterus is grossly enlarged fibroid uterus. Inflammatory changes around an exophytic fibroid off the right aspect of the uterus may be post procedural in etiology. 2.  Small to borderline enlarged iliac chain lymph nodes may be reactive.   PORTABLE CHEST - 1 VIEW   COMPARISON:  Chest radiograph February 12, 2013   FINDINGS: Cardiomediastinal silhouette is unremarkable. Patient is rotated to the right, accentuating left lung markings. Low inspiratory examination with crowded vascular markings. No pleural effusions or focal consolidations. No pneumothorax. Large body habitus. Osseous structures are nonsuspicious.   IMPRESSION: No acute cardiopulmonary process for this low inspiratory portable examination. PORTABLE RIGHT KNEE - 1-2 VIEW   COMPARISON:  None.   FINDINGS: Amputation below the knee .  Osteotomy sites appear well-defined.   Multiple gas bubbles are present in the soft tissues of the stump suggestive of soft tissue infection. Gas appears more extensive and irregular than would be expected for postoperative gas.  Correlate with symptoms.   There is a stent in the distal SFA and proximal popliteal artery.   IMPRESSION: Extensive gas in the soft tissues of the stump suggestive of infection.  ASSESSMENT/PLAN:  PAD-status post right AKA. Continue rehabilitation. Diabetes mellitus with vascular complications-continue Levemir and metformin. Hyperthyroidism-continue Tapazole Neuropathy-continue Neurontin CHF-compensated Hyperlipidemia-continue Lipitor Hypertension-well-controlled Check CBC and BMP  I have reviewed patient's medical records received at admission/from hospitalization.  CPT CODE: 86767  Gayani Y Dasanayaka, [place] [phone]

## 2014-03-06 ENCOUNTER — Non-Acute Institutional Stay (SKILLED_NURSING_FACILITY): Payer: Medicare HMO | Admitting: Internal Medicine

## 2014-03-06 DIAGNOSIS — F329 Major depressive disorder, single episode, unspecified: Secondary | ICD-10-CM

## 2014-03-06 DIAGNOSIS — D638 Anemia in other chronic diseases classified elsewhere: Secondary | ICD-10-CM

## 2014-03-06 DIAGNOSIS — D473 Essential (hemorrhagic) thrombocythemia: Secondary | ICD-10-CM

## 2014-03-06 DIAGNOSIS — F3289 Other specified depressive episodes: Secondary | ICD-10-CM

## 2014-03-11 ENCOUNTER — Non-Acute Institutional Stay (SKILLED_NURSING_FACILITY): Payer: Medicare HMO | Admitting: Internal Medicine

## 2014-03-11 ENCOUNTER — Other Ambulatory Visit: Payer: Self-pay | Admitting: *Deleted

## 2014-03-11 DIAGNOSIS — K5900 Constipation, unspecified: Secondary | ICD-10-CM

## 2014-03-11 MED ORDER — OXYCODONE HCL 15 MG PO TABS: ORAL_TABLET | ORAL | Status: AC

## 2014-03-11 NOTE — Telephone Encounter (Signed)
[place]

## 2014-03-12 DIAGNOSIS — D638 Anemia in other chronic diseases classified elsewhere: Secondary | ICD-10-CM | POA: Insufficient documentation

## 2014-03-12 DIAGNOSIS — F329 Major depressive disorder, single episode, unspecified: Secondary | ICD-10-CM | POA: Insufficient documentation

## 2014-03-12 DIAGNOSIS — F3289 Other specified depressive episodes: Secondary | ICD-10-CM | POA: Insufficient documentation

## 2014-03-12 NOTE — Progress Notes (Signed)
Patient ID: Alyssa Gregory, female   DOB: January 18, 1966, 48 y.o.   MRN: [id number]           PROGRESS NOTE  DATE: 03/06/2014          FACILITY:  [facility]  LEVEL OF CARE: SNF (31)  Acute Visit  CHIEF COMPLAINT:  Manage anemia of chronic disease and depression.    HISTORY OF PRESENT ILLNESS: I was requested by the staff to assess the patient regarding above problem(s):  ANEMIA: The anemia has been stable. The patient denies fatigue, melena or hematochezia. No complications from the medications currently being used.   On 03/05/2014:  Hemoglobin 9, MCV 71.  On 02/25/2014:  Hemoglobin 9.2.    DEPRESSION:  New problem.   Patient is complaining of feeling very depressed and sad about losing her right lower extremity.    PAST MEDICAL HISTORY : Reviewed.  No changes/see problem list  CURRENT MEDICATIONS: Reviewed per MAR/see medication list  REVIEW OF SYSTEMS:  GENERAL: no change in appetite, no fatigue, no weight changes, no fever, chills or weakness RESPIRATORY: no cough, SOB, DOE,, wheezing, hemoptysis CARDIAC: no chest pain, edema or palpitations GI: no abdominal pain, diarrhea, constipation, heart burn, nausea or vomiting  PHYSICAL EXAMINATION  VS: see VS section  GENERAL: no acute distress, morbidly obese body habitus EYES: conjunctivae normal, sclerae normal, normal eye lids NECK: supple, trachea midline, no neck masses, no thyroid tenderness, no thyromegaly LYMPHATICS: no LAN in the neck, no supraclavicular LAN RESPIRATORY: breathing is even & unlabored, BS CTAB CARDIAC: RRR, no murmur,no extra heart sounds, left lower extremity has +2 edema, right AKA     GI: abdomen soft, normal BS, no masses, no tenderness, no hepatomegaly, no splenomegaly PSYCHIATRIC: the patient is alert & oriented, depressed affect & mood, behavior appropriate, patient is crying             LABS/RADIOLOGY:  03/05/2014:  Platelets 411.    02/25/2014:  Platelets 541.      ASSESSMENT/PLAN:  Anemia of chronic disease.  Hemoglobin stable.  Iron studies pending.    Depression.  New onset.  Significant problem.   Start Lexapro 10 mg q.d.    Thrombocytosis.   Acute phase reactant.  Platelets have trended down.  We will monitor.     CPT CODE: 40981           Gayani Y Dasanayaka, [place] [phone]

## 2014-03-13 ENCOUNTER — Non-Acute Institutional Stay (SKILLED_NURSING_FACILITY): Payer: Medicare HMO | Admitting: Internal Medicine

## 2014-03-13 DIAGNOSIS — G4733 Obstructive sleep apnea (adult) (pediatric): Secondary | ICD-10-CM

## 2014-03-13 DIAGNOSIS — R5383 Other fatigue: Secondary | ICD-10-CM

## 2014-03-13 DIAGNOSIS — R5381 Other malaise: Secondary | ICD-10-CM

## 2014-03-19 ENCOUNTER — Other Ambulatory Visit: Payer: Self-pay | Admitting: *Deleted

## 2014-03-19 DIAGNOSIS — K5900 Constipation, unspecified: Secondary | ICD-10-CM | POA: Insufficient documentation

## 2014-03-19 MED ORDER — FENTANYL 50 MCG/HR TD PT72: MEDICATED_PATCH | TRANSDERMAL | Status: AC

## 2014-03-19 NOTE — Telephone Encounter (Signed)
[facility] 

## 2014-03-19 NOTE — Progress Notes (Signed)
Patient ID: Alyssa Gregory, female   DOB: 25-Jan-1966, 48 y.o.   MRN: [id number]           PROGRESS NOTE  DATE: 03/11/2014         FACILITY:  [facility]  LEVEL OF CARE: SNF (31)  Acute Visit  CHIEF COMPLAINT:  Manage constipation.    HISTORY OF PRESENT ILLNESS: I was requested by the staff to assess the patient regarding above problem(s):  Patient is complaining of constipation and having to strain to have a bowel movement.  Patient denies abdominal pain, nausea or vomiting.    PAST MEDICAL HISTORY : Reviewed.  No changes/see problem list  CURRENT MEDICATIONS: Reviewed per MAR/see medication list  REVIEW OF SYSTEMS:  GENERAL: no change in appetite, no fatigue, no weight changes, no fever, chills or weakness RESPIRATORY: no cough, SOB, DOE,, wheezing, hemoptysis CARDIAC: no chest pain, edema or palpitations GI: complains of constipation; no abdominal pain, diarrhea, heart burn, nausea or vomiting  PHYSICAL EXAMINATION  VS: see VS section  GENERAL: no acute distress, morbidly obese body habitus NECK: supple, trachea midline, no neck masses, no thyroid tenderness, no thyromegaly RESPIRATORY: breathing is even & unlabored, BS CTAB CARDIAC: RRR, no murmur,no extra heart sounds, +3 bilateral lower extremity edema      GI: abdomen soft, normal BS, no masses, no tenderness, no hepatomegaly, no splenomegaly PSYCHIATRIC: the patient is alert & oriented to person, affect & behavior appropriate  ASSESSMENT/PLAN:  Constipation.  New problem.  Start MiraLAX 17 g q.d.       CPT CODE: 99357          Gayani Y Dasanayaka, [place] [phone]

## 2014-03-22 DIAGNOSIS — G4733 Obstructive sleep apnea (adult) (pediatric): Secondary | ICD-10-CM | POA: Insufficient documentation

## 2014-03-22 NOTE — Progress Notes (Signed)
Patient ID: Alyssa Gregory, female   DOB: 03-06-1966, 48 y.o.   MRN: [id number]           PROGRESS NOTE  DATE: 03/13/2014        FACILITY:  [facility]  LEVEL OF CARE: SNF (31)  Acute Visit  CHIEF COMPLAINT:  Manage lethargy.    HISTORY OF PRESENT ILLNESS: I was requested by the staff to assess the patient regarding above problem(s):  Patient's mother requested a medication evaluation since the patient is sleeping all the time.  She feels that the patient is sedated.  Patient is currently receiving Duragesic patch, Xanax, Lexapro, Neurontin, and Elavil, all of which can cause sleepiness.    PAST MEDICAL HISTORY : Reviewed.  No changes/see problem list  CURRENT MEDICATIONS: Reviewed per MAR/see medication list  REVIEW OF SYSTEMS:  GENERAL: no change in appetite, no fatigue, no weight changes, no fever, chills or weakness RESPIRATORY: no cough, SOB, DOE,, wheezing, hemoptysis CARDIAC: no chest pain or palpitations; left lower extremity swelling        GI: no abdominal pain, diarrhea, constipation, heart burn, nausea or vomiting  PHYSICAL EXAMINATION  VS: see VS section  GENERAL: no acute distress, morbidly obese body habitus EYES: conjunctivae normal, sclerae normal, normal eye lids NECK: supple, trachea midline, no neck masses, no thyroid tenderness, no thyromegaly LYMPHATICS: no LAN in the neck, no supraclavicular LAN RESPIRATORY: breathing is even & unlabored, BS CTAB CARDIAC: RRR, no murmur,no extra heart sounds, left lower extremity +2 edema       GI: abdomen soft, normal BS, no masses, no tenderness, no hepatomegaly, no splenomegaly PSYCHIATRIC: the patient is alert & oriented to person, affect & behavior appropriate  LABS/RADIOLOGY:  On 03/05/2014:  Hemoglobin 9, MCV 71, platelets 411, WBC 6.9.    Glucose 193, otherwise BMP normal.       ASSESSMENT/PLAN:  Lethargy.  Significant, new problem.  Medications reviewed.  At this point, the patient  does need the Lexapro, Xanax, and the Duragesic patch due to depressive symptoms, anxiety, and pain.  Therefore, we will taper off Neurontin and Elavil.    Obstructive sleep apnea.  We will request facility to provide CPAP, which would also help her daytime sleepiness.    I discussed her medications with patient and family at bedside.    Total patient care time:  Greater than 25 minutes.     CPT CODE: 11941            Markeem Noreen Y Kashawn Manzano, [place] [phone]

## 2014-03-25 ENCOUNTER — Other Ambulatory Visit: Payer: Self-pay | Admitting: *Deleted

## 2014-03-25 MED ORDER — ALPRAZOLAM 0.25 MG PO TABS: ORAL_TABLET | ORAL | Status: AC

## 2014-03-25 NOTE — Telephone Encounter (Signed)
[facility] 

## 2014-03-27 ENCOUNTER — Non-Acute Institutional Stay (SKILLED_NURSING_FACILITY): Payer: Medicare HMO | Admitting: Internal Medicine

## 2014-03-27 DIAGNOSIS — I739 Peripheral vascular disease, unspecified: Secondary | ICD-10-CM

## 2014-03-27 DIAGNOSIS — G909 Disorder of the autonomic nervous system, unspecified: Secondary | ICD-10-CM

## 2014-03-27 DIAGNOSIS — E1149 Type 2 diabetes mellitus with other diabetic neurological complication: Secondary | ICD-10-CM

## 2014-03-27 DIAGNOSIS — E1059 Type 1 diabetes mellitus with other circulatory complications: Secondary | ICD-10-CM

## 2014-03-27 DIAGNOSIS — E0590 Thyrotoxicosis, unspecified without thyrotoxic crisis or storm: Secondary | ICD-10-CM

## 2014-03-27 DIAGNOSIS — E1143 Type 2 diabetes mellitus with diabetic autonomic (poly)neuropathy: Secondary | ICD-10-CM

## 2014-03-28 DIAGNOSIS — E1140 Type 2 diabetes mellitus with diabetic neuropathy, unspecified: Secondary | ICD-10-CM | POA: Insufficient documentation

## 2014-03-28 NOTE — Progress Notes (Signed)
         PROGRESS NOTE  DATE: 03/27/2014  FACILITY: Nursing Home Location: [facility]  LEVEL OF CARE: SNF (31)  Routine Visit  CHIEF COMPLAINT:  Manage PVD, diabetes mellitus and hyperthyroidism  HISTORY OF PRESENT ILLNESS:  REASSESSMENT OF ONGOING PROBLEM(S):  PVD: The patient's peripheral vascular disease remains table. The patient denies ongoing claudication. No complications reported from the medication(s) currently being used. She is status post right AKA.  DM:pt's DM remains stable.  Pt denies polyuria, polydipsia, polyphagia, changes in vision or hypoglycemic episodes.  No complications noted from the medication presently being used.  Last hemoglobin A1c is: Not available.  HYPERTHYROIDISM: Patient's hyperthyroidism remains stable.  Patient denies anxiety, irritability or palpitations. No complications reported from current medication being used. Last TFTs are: Not available.  PAST MEDICAL HISTORY : Reviewed.  No changes/see problem list  CURRENT MEDICATIONS: Reviewed per MAR/see medication list  REVIEW OF SYSTEMS:  GENERAL: no change in appetite, no fatigue, no weight changes, no fever, chills or weakness RESPIRATORY: no cough, SOB, DOE, wheezing, hemoptysis CARDIAC: no chest pain, edema or palpitations GI: no abdominal pain, diarrhea, constipation, heart burn, nausea or vomiting  PHYSICAL EXAMINATION  VS:  See VS section  GENERAL: no acute distress, morbidly obese body habitus EYES: conjunctivae normal, sclerae normal, normal eye lids NECK: supple, trachea midline, no neck masses, no thyroid tenderness, no thyromegaly LYMPHATICS: no LAN in the neck, no supraclavicular LAN RESPIRATORY: breathing is even & unlabored, BS CTAB CARDIAC: RRR, no murmur,no extra heart sounds, no edema GI: abdomen soft, normal BS, no masses, no tenderness, no hepatomegaly, no splenomegaly PSYCHIATRIC: the patient is alert & oriented to person, affect & behavior  appropriate  LABS/RADIOLOGY:  8-15 hemoglobin 9, MCV 71, platelets 411, WBC 6.9, glucose 193 otherwise BMP normal  ASSESSMENT/PLAN:  PVD-status post right AKA. 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 Hyperthyroidism-check thyroid function tests. Neuropathy-Neurontin was tapered off due to lethargy. Hyperlipidemia-check fasting lipid panel Constipation-well-controlled Hypertension-well-controlled Depression-Lexapro was started Check liver profile  CPT CODE: 94585  Desaree Downen Y Afton Lavalle, MD [facility] [phone]

## 2014-04-03 ENCOUNTER — Non-Acute Institutional Stay (SKILLED_NURSING_FACILITY): Payer: Medicare HMO | Admitting: Internal Medicine

## 2014-04-03 DIAGNOSIS — E1059 Type 1 diabetes mellitus with other circulatory complications: Secondary | ICD-10-CM

## 2014-04-03 DIAGNOSIS — E0590 Thyrotoxicosis, unspecified without thyrotoxic crisis or storm: Secondary | ICD-10-CM

## 2014-04-08 ENCOUNTER — Non-Acute Institutional Stay (SKILLED_NURSING_FACILITY): Payer: Medicare HMO | Admitting: Internal Medicine

## 2014-04-08 DIAGNOSIS — E7800 Pure hypercholesterolemia, unspecified: Secondary | ICD-10-CM

## 2014-04-08 DIAGNOSIS — E1159 Type 2 diabetes mellitus with other circulatory complications: Secondary | ICD-10-CM

## 2014-04-08 DIAGNOSIS — E0590 Thyrotoxicosis, unspecified without thyrotoxic crisis or storm: Secondary | ICD-10-CM

## 2014-04-08 DIAGNOSIS — I739 Peripheral vascular disease, unspecified: Secondary | ICD-10-CM

## 2014-04-08 NOTE — Progress Notes (Signed)
         PROGRESS NOTE  DATE: 04-08-14  FACILITY: Nursing Home Location: [facility]  LEVEL OF CARE: SNF (31)  Discharge Visit  CHIEF COMPLAINT:  Manage PVD, diabetes mellitus and hyperthyroidism  HISTORY OF PRESENT ILLNESS: I was requested by the social worker to perform face-to-face evaluation for discharge on 04-10-14.  REASSESSMENT OF ONGOING PROBLEM(S):  PVD: The patient's peripheral vascular disease remains table. The patient denies ongoing claudication. No complications reported from the medication(s) currently being used. She is status post right AKA.  DM:pt's DM is unstable.  Pt denies polyuria, polydipsia, polyphagia, changes in vision or hypoglycemic episodes.  No complications noted from the medication presently being used.  Last hemoglobin A1c is: 8.2 on 04-02-14.  HYPERTHYROIDISM: Patient's hyperthyroidism remains stable.  Patient denies anxiety, irritability or palpitations. No complications reported from current medication being used. Last TFTs are: TSH 0.005, free T3  3.6, free T4  1.4  PAST MEDICAL HISTORY : Reviewed.  No changes/see problem list  CURRENT MEDICATIONS: Reviewed per MAR/see medication list  REVIEW OF SYSTEMS:  GENERAL: no change in appetite, no fatigue, no weight changes, no fever, chills or weakness RESPIRATORY: no cough, SOB, DOE, wheezing, hemoptysis CARDIAC: no chest pain,  or palpitations, complains of left lower extremity swelling GI: no abdominal pain, diarrhea, constipation, heart burn, nausea or vomiting  PHYSICAL EXAMINATION  VS:  See VS section  GENERAL: no acute distress, morbidly obese body habitus EYES: conjunctivae normal, sclerae normal, normal eye lids NECK: supple, trachea midline, no neck masses, no thyroid tenderness, no thyromegaly LYMPHATICS: no LAN in the neck, no supraclavicular LAN RESPIRATORY: breathing is even & unlabored, BS CTAB CARDIAC: RRR, no murmur,no extra heart sounds, left lower  extremity +2 edema, right aka GI: abdomen soft, normal BS, no masses, no tenderness, no hepatomegaly, no splenomegaly PSYCHIATRIC: the patient is alert & oriented to person, affect & behavior appropriate SKIN: The right buttock has an area of 2cm in diameter, firm & tender to plapation  LABS/RADIOLOGY:  04-02-14  FLP normal, albumin 3.4, alkaline phosphatase 148 otherwise liver profile normal 8-15 hemoglobin 9, MCV 71, platelets 411, WBC 6.9, glucose 193 otherwise BMP normal  ASSESSMENT/PLAN:  PVD-status post right AKA. Diabetes mellitus with vascular complications-uncontrolled. medications adjusted. Hyperthyroidism-well-controlled Hyperlipidemia-well controlled Constipation-well-controlled Hypertension-well-controlled Depression-on Lexapro  I have filled out patient's discharge paperwork and written prescriptions. Patient will receive home health PT, OT and RN DME: Deep manual wheelchair, pressure relief cushion, elevated leg rests, drop arm rests, tub transfer bench, drop arm beriatric commode  Discharge time involved coordination of discharge process with physical therapy, social worker and nursing staff. Time greater than 30 minutes  CPT CODE: 73220  Gayani Y Dasanayaka, MD [facility] [phone]

## 2014-04-08 NOTE — Progress Notes (Addendum)
Patient ID: Alyssa Gregory, female   DOB: 1966-06-22, 48 y.o.   MRN: [id number]           PROGRESS NOTE  DATE: 04/03/2014         FACILITY:  [facility]  LEVEL OF CARE: SNF (31)  Acute Visit  CHIEF COMPLAINT:  Manage diabetes mellitus and hyperthyroidism.     HISTORY OF PRESENT ILLNESS: I was requested by the staff to assess the patient regarding above problem(s):  DM:pt's DM is unstable.  Pt denies polyuria, polydipsia, polyphagia, changes in vision or hypoglycemic episodes.  No complications noted from the medication presently being used.  Last hemoglobin A1c is:   On 04/02/2014:  Hemoglobin A1c 8.2.    HYPERTHYROIDISM: Patient's hyperthyroidism remains stable.  Patient denies anxiety, irritability or palpitations. No complications reported from current medication being used. Last TFTs are:   On 04/02/2014:   Free T4 1.42, free T3 3.6, TSH 0.005.    PAST MEDICAL HISTORY : Reviewed.  No changes/see problem list  CURRENT MEDICATIONS: Reviewed per MAR/see medication list  REVIEW OF SYSTEMS:  GENERAL: no change in appetite, no fatigue, no weight changes, no fever, chills or weakness RESPIRATORY: no cough, SOB, DOE,, wheezing, hemoptysis CARDIAC: no chest pain, edema or palpitations GI: no abdominal pain, diarrhea, constipation, heart burn, nausea or vomiting  PHYSICAL EXAMINATION  VS: see VS section  GENERAL: no acute distress, morbidly obese body habitus EYES: conjunctivae normal, sclerae normal, normal eye lids NECK: supple, trachea midline, no neck masses, no thyroid tenderness, no thyromegaly LYMPHATICS: no LAN in the neck, no supraclavicular LAN RESPIRATORY: breathing is even & unlabored, BS CTAB CARDIAC: RRR, no murmur,no extra heart sounds, no edema GI: abdomen soft, normal BS, no masses, no tenderness, no hepatomegaly, no splenomegaly PSYCHIATRIC: the patient is alert & oriented to person, affect & behavior appropriate  ASSESSMENT/PLAN:  Diabetes  mellitus with vascular complications.  Uncontrolled problem.  Increase Glucophage to 1000 mg b.i.d.        Hyperthyroidism.  Free T3 and free T4 are normal.  Therefore, we will not adjust Tapazole.  Recheck thyroid function studies in two months.    CPT CODE: 49179            Laynee Lockamy Y Kal Chait, [place] [phone]

## 2014-04-10 ENCOUNTER — Non-Acute Institutional Stay (SKILLED_NURSING_FACILITY): Payer: Medicare HMO | Admitting: Internal Medicine

## 2014-04-10 DIAGNOSIS — IMO0001 Reserved for inherently not codable concepts without codable children: Secondary | ICD-10-CM

## 2014-04-10 DIAGNOSIS — M7918 Myalgia, other site: Secondary | ICD-10-CM

## 2014-04-11 NOTE — Progress Notes (Addendum)
Patient ID: Alyssa Gregory, female   DOB: [birth year], 48 y.o.   MRN: [id number]           PROGRESS NOTE  DATE: 04/10/2014          FACILITY:  [facility]  LEVEL OF CARE: SNF (31)  Acute Visit  CHIEF COMPLAINT:  Manage right buttock mass/pain.    HISTORY OF PRESENT ILLNESS: I was requested by the staff to assess the patient regarding above problem(s):  Due to painful, palpable tissue in patient's right upper buttock, an ultrasound was done on 04/08/2014 which shows area of relative decreased echogenic area measuring 3 x 1.6 cm at the site of the patient's palpable abnormality.    PAST MEDICAL HISTORY : Reviewed.  No changes/see problem list  CURRENT MEDICATIONS: Reviewed per MAR/see medication list  REVIEW OF SYSTEMS:  GENERAL: no change in appetite, no fatigue, no weight changes, no fever, chills or weakness RESPIRATORY: no cough, SOB, DOE,, wheezing, hemoptysis CARDIAC: no chest pain or palpitations; left lower extremity swelling        GI: no abdominal pain, diarrhea, constipation, heart burn, nausea or vomiting  PHYSICAL EXAMINATION  VS: see VS section  GENERAL: no acute distress, morbidly obese body habitus RESPIRATORY: breathing is even & unlabored, BS CTAB CARDIAC: RRR, no murmur,no extra heart sounds, left lower extremity +2 edema        GI: abdomen soft, normal BS, no masses, no tenderness, no hepatomegaly, no splenomegaly PSYCHIATRIC: the patient is alert & oriented to person, affect & behavior appropriate  ASSESSMENT/PLAN:  Right upper buttock mass/pain.  No definitive diagnosis made by ultrasound.   Radiologist recommends MRI for more information if needed.  Discussed with patient.  Since she is getting discharged today, we will give a copy of the ultrasound report to her to take to primary care physician.    CPT CODE: 87564           Tafari Humiston Y Ryver Zadrozny, [place] [phone]

## 2014-04-15 DIAGNOSIS — I10 Essential (primary) hypertension: Secondary | ICD-10-CM

## 2014-04-15 DIAGNOSIS — J449 Chronic obstructive pulmonary disease, unspecified: Secondary | ICD-10-CM

## 2014-04-15 DIAGNOSIS — E119 Type 2 diabetes mellitus without complications: Secondary | ICD-10-CM

## 2014-04-15 DIAGNOSIS — I739 Peripheral vascular disease, unspecified: Secondary | ICD-10-CM

## 2014-10-26 ENCOUNTER — Other Ambulatory Visit: Payer: Self-pay | Admitting: Family Medicine

## 2014-11-27 ENCOUNTER — Other Ambulatory Visit: Payer: Self-pay | Admitting: Family Medicine

## 2015-02-12 ENCOUNTER — Other Ambulatory Visit: Payer: Self-pay | Admitting: Family Medicine

## 2015-02-12 IMAGING — CR DG KNEE 1-2V PORT*R*
2 series · 2 of 2 positions shown · non-contrast
Comparison: None.

CLINICAL DATA: Right lower leg amputation 01/30/2014.  Diabetes

EXAM:
PORTABLE RIGHT KNEE - 1-2 VIEW

[AP]
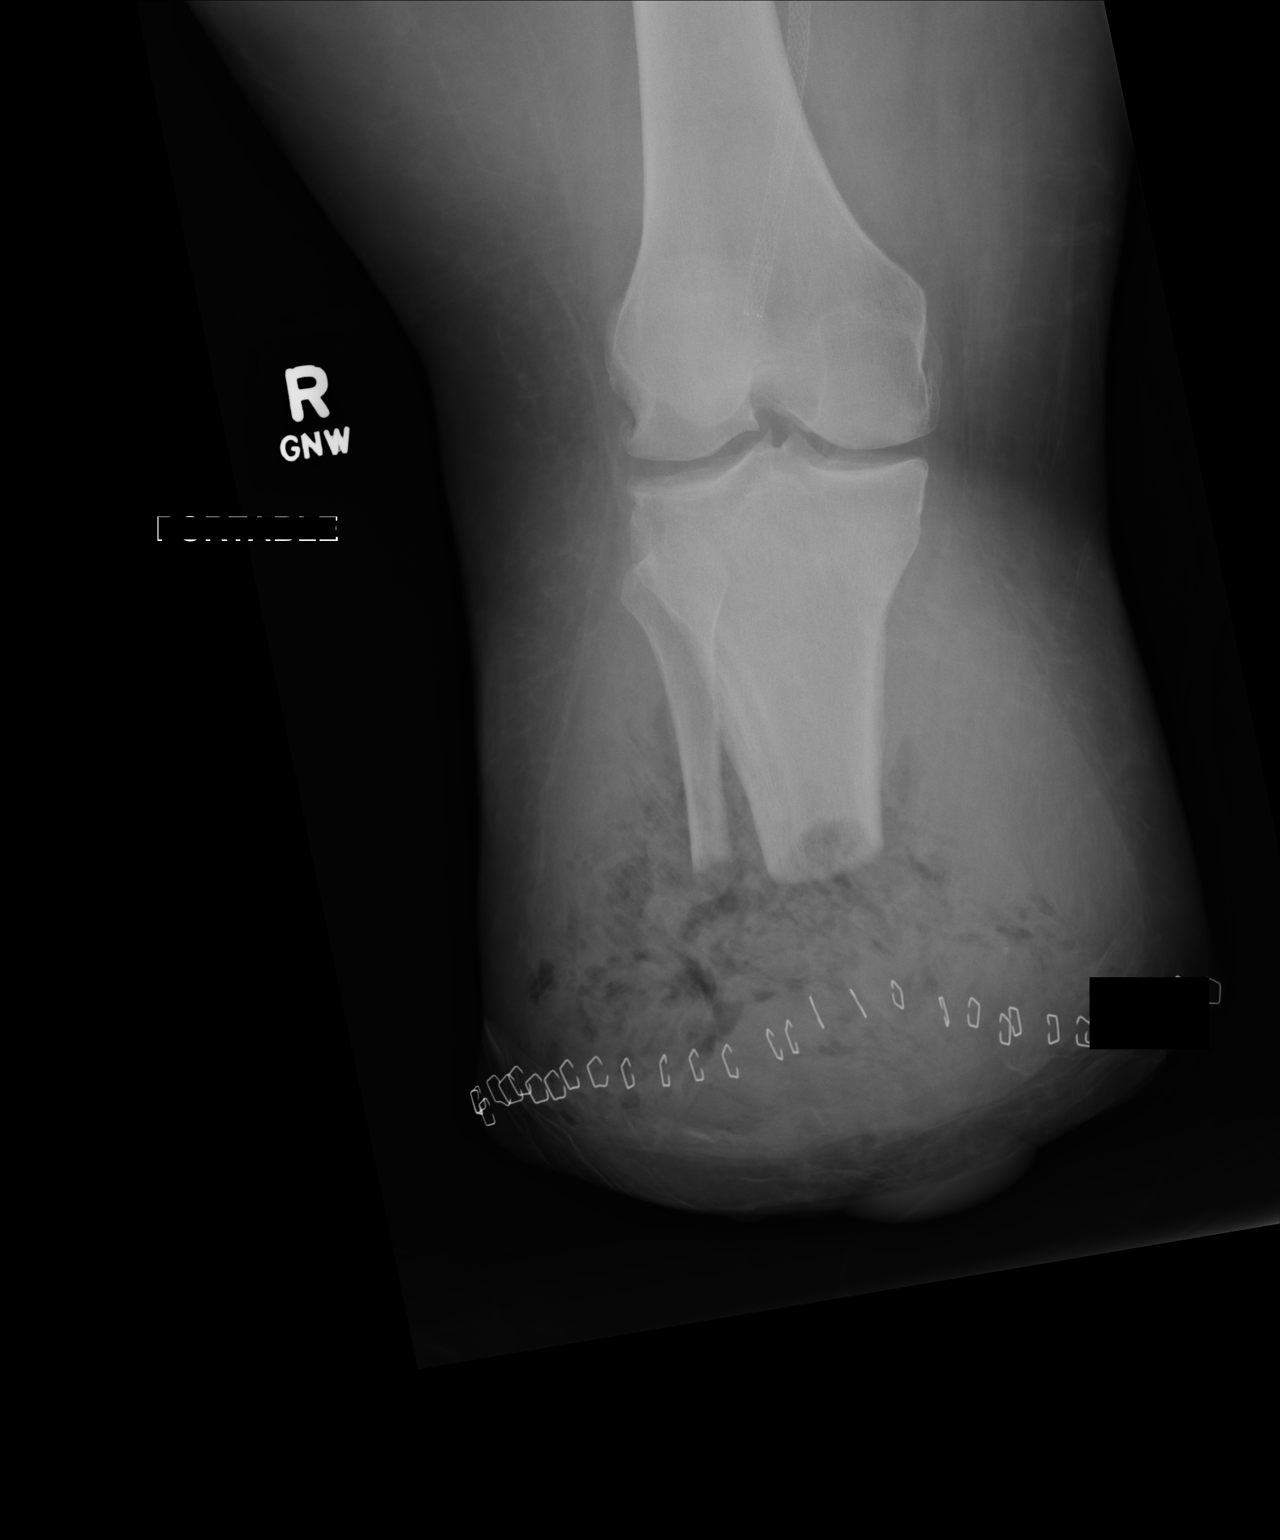

[xtable lateral]
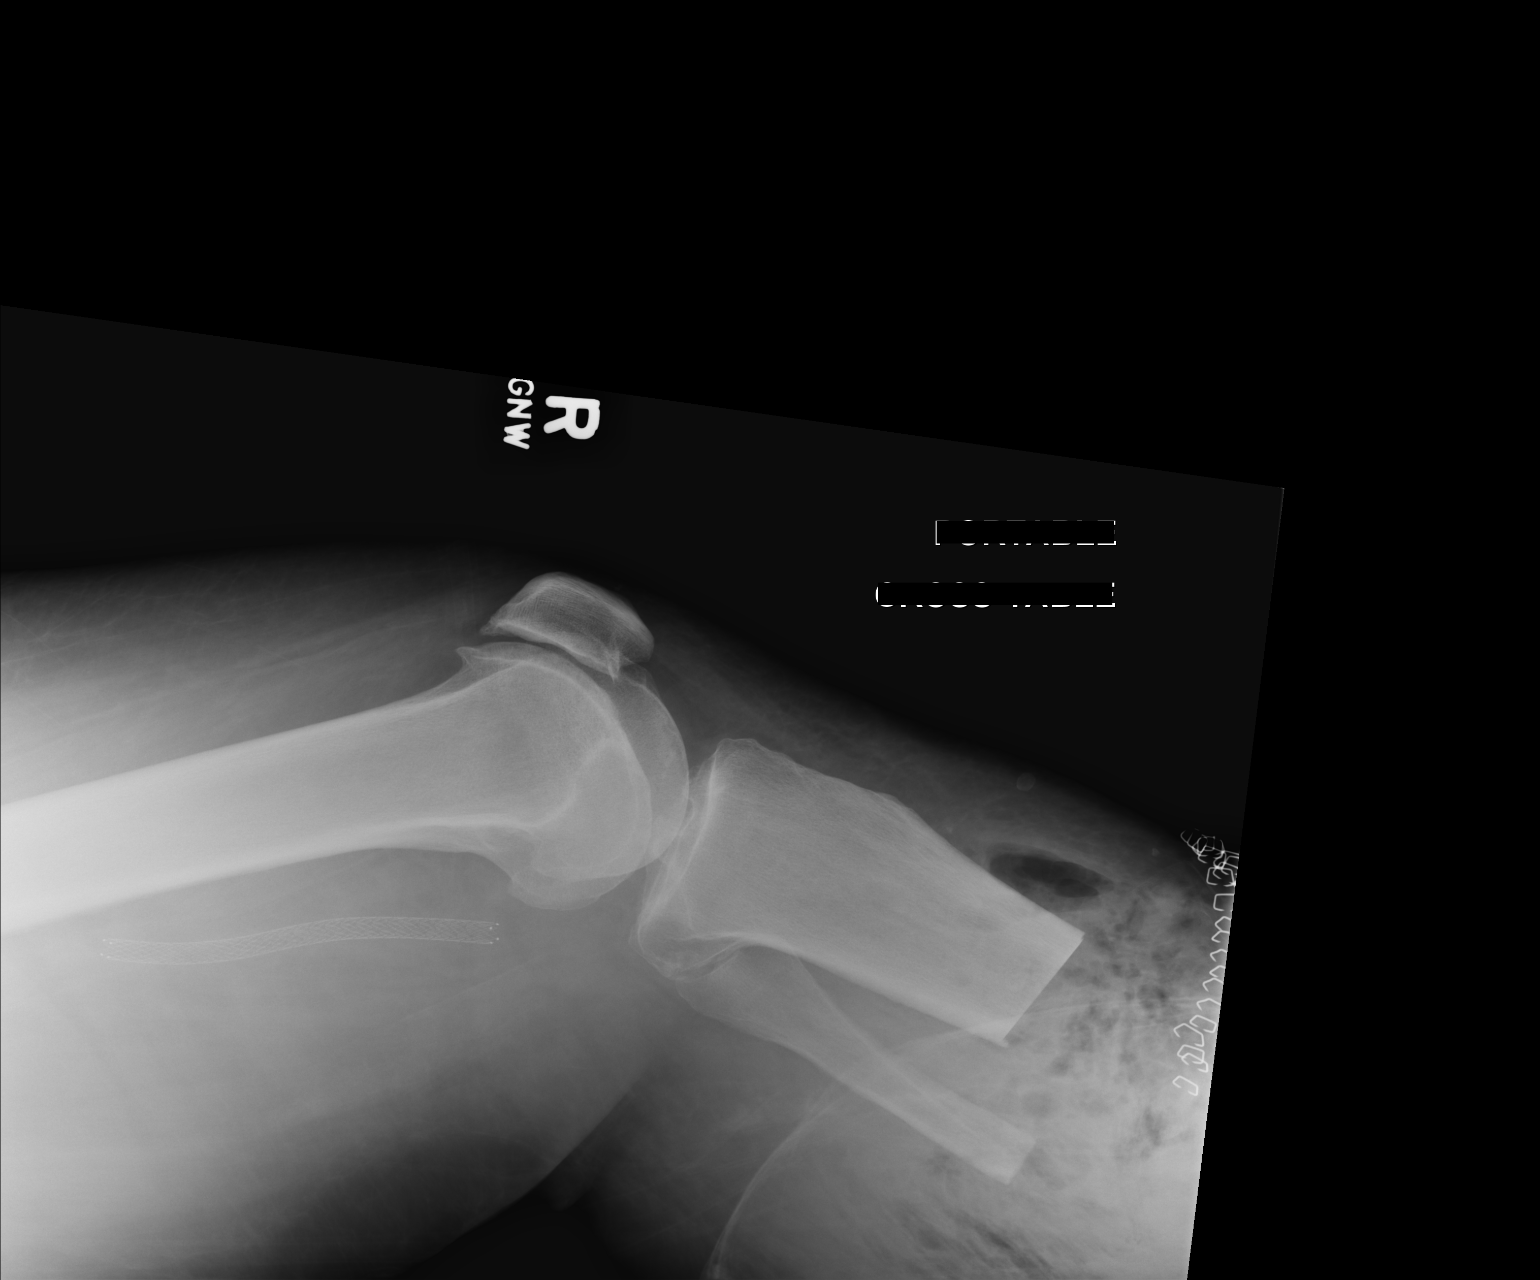

[2 of 2 positions shown; findings below may reference images not displayed]

FINDINGS: Amputation below the knee .  Osteotomy sites appear well-defined.

Multiple gas bubbles are present in the soft tissues of the stump
suggestive of soft tissue infection. Gas appears more extensive and
irregular than would be expected for postoperative gas. Correlate
with symptoms.

There is a stent in the distal SFA and proximal popliteal artery.
IMPRESSION: Extensive gas in the soft tissues of the stump suggestive of
infection.

## 2015-04-14 ENCOUNTER — Other Ambulatory Visit: Payer: Self-pay | Admitting: Family Medicine

## 2015-05-13 ENCOUNTER — Other Ambulatory Visit: Payer: Self-pay | Admitting: Family Medicine

## 2022-02-09 DEATH — deceased
